# Patient Record
Sex: Male | Born: 2000 | Race: White | Hispanic: No | Marital: Single | Smoking: Never smoker
Health system: Southern US, Community
[De-identification: ages and names within clinical notes are randomized; demographics above are authoritative.]

---
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
## 2022-06-08 ENCOUNTER — Emergency Department: Payer: BC Managed Care – PPO

## 2022-06-08 ENCOUNTER — Other Ambulatory Visit: Payer: Self-pay

## 2022-06-08 ENCOUNTER — Emergency Department
Admission: EM | Admit: 2022-06-08 | Discharge: 2022-06-09 | Disposition: A | Discharge status: Home / Self Care | Payer: BC Managed Care – PPO | Attending: Emergency Medicine | Admitting: Emergency Medicine

## 2022-06-08 DIAGNOSIS — R112 Nausea with vomiting, unspecified: Secondary | ICD-10-CM | POA: Diagnosis not present

## 2022-06-08 DIAGNOSIS — R197 Diarrhea, unspecified: Secondary | ICD-10-CM | POA: Insufficient documentation

## 2022-06-08 DIAGNOSIS — R1031 Right lower quadrant pain: Secondary | ICD-10-CM | POA: Insufficient documentation

## 2022-06-08 DIAGNOSIS — E860 Dehydration: Secondary | ICD-10-CM

## 2022-06-08 LAB — CBC
HCT: 48.7 % (ref 39.0–52.0)
Hemoglobin: 16.5 g/dL (ref 13.0–17.0)
MCH: 30.6 pg (ref 26.0–34.0)
MCHC: 33.9 g/dL (ref 30.0–36.0)
MCV: 90.2 fL (ref 80.0–100.0)
Platelets: 218 10*3/uL (ref 150–400)
RBC: 5.4 MIL/uL (ref 4.22–5.81)
RDW: 11.5 % (ref 11.5–15.5)
WBC: 9.7 10*3/uL (ref 4.0–10.5)
nRBC: 0 % (ref 0.0–0.2)

## 2022-06-08 LAB — LIPASE, BLOOD: Lipase: 34 U/L (ref 11–51)

## 2022-06-08 LAB — URINALYSIS, ROUTINE W REFLEX MICROSCOPIC
Bacteria, UA: NONE SEEN
Bilirubin Urine: NEGATIVE
Glucose, UA: NEGATIVE mg/dL
Hgb urine dipstick: NEGATIVE
Ketones, ur: 80 mg/dL — AB
Leukocytes,Ua: NEGATIVE
Nitrite: NEGATIVE
Protein, ur: 30 mg/dL — AB
Specific Gravity, Urine: 1.027 (ref 1.005–1.030)
pH: 7 (ref 5.0–8.0)

## 2022-06-08 LAB — COMPREHENSIVE METABOLIC PANEL
ALT: 23 U/L (ref 0–44)
AST: 29 U/L (ref 15–41)
Albumin: 4.8 g/dL (ref 3.5–5.0)
Alkaline Phosphatase: 67 U/L (ref 38–126)
Anion gap: 12 (ref 5–15)
BUN: 22 mg/dL — ABNORMAL HIGH (ref 6–20)
CO2: 23 mmol/L (ref 22–32)
Calcium: 10 mg/dL (ref 8.9–10.3)
Chloride: 102 mmol/L (ref 98–111)
Creatinine, Ser: 1.09 mg/dL (ref 0.61–1.24)
GFR, Estimated: >60 mL/min (ref >60)
Glucose, Bld: 151 mg/dL — ABNORMAL HIGH (ref 70–99)
Potassium: 4 mmol/L (ref 3.5–5.1)
Sodium: 137 mmol/L (ref 135–145)
Total Bilirubin: 1.7 mg/dL — ABNORMAL HIGH (ref 0.3–1.2)
Total Protein: 8.1 g/dL (ref 6.5–8.1)

## 2022-06-08 MED ORDER — FENTANYL CITRATE PF 50 MCG/ML IJ SOSY
50.0000 ug | PREFILLED_SYRINGE | Freq: Once | INTRAMUSCULAR | Status: AC
  Administered 2022-06-08: 50 ug via INTRAVENOUS
  Filled 2022-06-08: qty 1

## 2022-06-08 MED ORDER — LACTATED RINGERS IV BOLUS
1000.0000 mL | Freq: Once | INTRAVENOUS | Status: AC
  Administered 2022-06-08: 1000 mL via INTRAVENOUS

## 2022-06-08 MED ORDER — HYDROMORPHONE HCL 1 MG/ML IJ SOLN
1.0000 mg | Freq: Once | INTRAMUSCULAR | Status: AC
  Administered 2022-06-08: 1 mg via INTRAVENOUS
  Filled 2022-06-08: qty 1

## 2022-06-08 MED ORDER — IOHEXOL 300 MG/ML  SOLN
100.0000 mL | Freq: Once | INTRAMUSCULAR | Status: AC | PRN
  Administered 2022-06-08: 100 mL via INTRAVENOUS

## 2022-06-08 MED ORDER — ONDANSETRON HCL 4 MG/2ML IJ SOLN
4.0000 mg | Freq: Once | INTRAMUSCULAR | Status: AC
  Administered 2022-06-08: 4 mg via INTRAVENOUS
  Filled 2022-06-08: qty 2

## 2022-06-08 NOTE — ED Provider Notes (Signed)
[facility] Provider Note    Event Date/Time   First MD Initiated Contact with Patient 06/08/22 2308     (approximate)   History   Abdominal Pain (x3pm)   HPI  Carlos Lloyd is a 22 y.o. male who presents to the ED for evaluation of Abdominal Pain (x3pm)   Otherwise healthy local college student presents to the ED with his mother for evaluation of about 12 hours of nausea, vomiting and diarrhea and abdominal pain.  Pain is initially generalized with emesis and diarrhea and he thought it was "just a stomach bug."  Pain then radiated towards the right side and patient reports feeling worse and poor appetite and unable to keep anything down so he presents to the ED.  No known fevers.  No significant medical history or surgical history.   Physical Exam   Triage Vital Signs: ED Triage Vitals  Enc Vitals Group     BP 06/08/22 2246 134/78     Pulse Rate 06/08/22 2246 (!) 108     Resp 06/08/22 2246 (!) 26     Temp 06/08/22 2246 (!) 97.4 F (36.3 C)     Temp Source 06/08/22 2246 Oral     SpO2 06/08/22 2246 100 %     Weight 06/08/22 2248 155 lb (70.3 kg)     Height 06/08/22 2248 5' 11"$  (1.803 m)     Head Circumference --      Peak Flow --      Pain Score 06/08/22 2248 5     Pain Loc --      Pain Edu? --      Excl. in [place]? --     Most recent vital signs: Vitals:   06/09/22 0100 06/09/22 0135  BP: 118/67 122/70  Pulse: 98 80  Resp: 16 16  Temp:    SpO2: 98% 99%    General: Awake, no distress.  Seems dry.  Requesting water. CV:  Good peripheral perfusion.  Resp:  Normal effort.  Abd:  No distention.  Poorly localizing tenderness throughout the lower abdomen, including the RLQ.  No peritoneal features. MSK:  No deformity noted.  Neuro:  No focal deficits appreciated. Other:     ED Results / Procedures / Treatments   Labs (all labs ordered are listed, but only abnormal results are displayed) Labs Reviewed  COMPREHENSIVE METABOLIC PANEL -  Abnormal; Notable for the following components:      Result Value   Glucose, Bld 151 (*)    BUN 22 (*)    Total Bilirubin 1.7 (*)    All other components within normal limits  URINALYSIS, ROUTINE W REFLEX MICROSCOPIC - Abnormal; Notable for the following components:   Color, Urine YELLOW (*)    APPearance CLOUDY (*)    Ketones, ur 80 (*)    Protein, ur 30 (*)    All other components within normal limits  LIPASE, BLOOD  CBC    EKG   RADIOLOGY CT abdomen/pelvis interpreted by me without evidence of appendicitis or SBO.  Official radiology report(s): CT ABDOMEN PELVIS W CONTRAST  Result Date: 06/09/2022 CLINICAL DATA:  Right lower quadrant abdominal pain. EXAM: CT ABDOMEN AND PELVIS WITH CONTRAST TECHNIQUE: Multidetector CT imaging of the abdomen and pelvis was performed using the standard protocol following bolus administration of intravenous contrast. RADIATION DOSE REDUCTION: This exam was performed according to the departmental dose-optimization program which includes automated exposure control, adjustment of the mA and/or kV according to patient size and/or  use of iterative reconstruction technique. CONTRAST:  123m OMNIPAQUE IOHEXOL 300 MG/ML  SOLN COMPARISON:  None Available. FINDINGS: Lower chest: The visualized lung bases are clear. No intra-abdominal free air or free fluid. Hepatobiliary: No focal liver abnormality is seen. No gallstones, gallbladder wall thickening, or biliary dilatation. Pancreas: Unremarkable. No pancreatic ductal dilatation or surrounding inflammatory changes. Spleen: Normal in size without focal abnormality. Adrenals/Urinary Tract: The adrenal glands unremarkable. The kidneys and urinary bladder appear unremarkable. Stomach/Bowel: There is loose stool throughout the colon consistent with diarrheal state. Correlation with clinical exam and stool cultures recommended. There is no bowel obstruction or active inflammation. The appendix is not identified with  certainty. A somewhat linear or tubular structure in the right lower quadrant adjacent to the cecum (coronal 32/5) may represent a vessel or less likely a normal appendix. However, no inflammatory changes noted in the right lower quadrant or pericecal lesion. Vascular/Lymphatic: The abdominal aorta and IVC are unremarkable. No portal venous gas. There is no adenopathy. Reproductive: The prostate and seminal vesicles are grossly unremarkable. No pelvic mass. Other: None Musculoskeletal: No acute or significant osseous findings. IMPRESSION: 1. Diarrheal state. Correlation with clinical exam and stool cultures recommended. No bowel obstruction. 2. Nonvisualization of the appendix. No findings to suggest acute appendicitis. Electronically Signed   By: AAnner CreteM.D.   On: 06/09/2022 00:12    PROCEDURES and INTERVENTIONS:  Procedures  Medications  fentaNYL (SUBLIMAZE) injection 50 mcg (50 mcg Intravenous Given 06/08/22 2254)  ondansetron (ZOFRAN) injection 4 mg (4 mg Intravenous Given 06/08/22 2339)  lactated ringers bolus 1,000 mL (0 mLs Intravenous Stopped 06/09/22 0109)  HYDROmorphone (DILAUDID) injection 1 mg (1 mg Intravenous Given 06/08/22 2340)  iohexol (OMNIPAQUE) 300 MG/ML solution 100 mL (100 mLs Intravenous Contrast Given 06/08/22 2347)  lactated ringers bolus 1,000 mL (0 mLs Intravenous Stopped 06/09/22 0135)     IMPRESSION / MDM / ASSESSMENT AND PLAN / ED COURSE  I reviewed the triage vital signs and the nursing notes.  Differential diagnosis includes, but is not limited to, gastroenteritis, dehydration, acute cystitis, diverticulitis, appendicitis or cholecystitis.  {Patient presents with symptoms of an acute illness or injury that is potentially life-threatening.  Healthy 22year old presents with GI symptoms, possibly a gastroenteritis and suitable for outpatient management.  He is tachycardic and has RLQ tenderness on presentation concerning for the possibility of appendicitis.   Blood work without leukocytosis or significant metabolic derangements.  Normal lipase.  Urine without infectious features, but significant Mehta ketones suggestive of dehydration.  CT without evidence of appendicitis or more severe derangements beyond a diarrheal state.  Resolution of symptoms after fluid resuscitation.  Possibly viral etiology.  Suitable for outpatient management.  We discussed return precautions.  Clinical Course as of 06/09/22 0551  Mon Jun 09, 2022  0021 Reassessed.  Patient reports feeling better.  We discussed reassuring CT scan and possible etiologies of his symptoms.  Discussed ketonuria and dehydration [DS]  0116 Reassessed.  Feeling well.  Tolerating p.o.  We discussed Zofran, expectant management and return precautions.  Answered questions. [DS]    Clinical Course User Index [DS] SVladimir Crofts MD     FINAL CLINICAL IMPRESSION(S) / ED DIAGNOSES   Final diagnoses:  Right lower quadrant abdominal pain  Nausea vomiting and diarrhea  Dehydration     Rx / DC Orders   ED Discharge Orders          Ordered    ondansetron (ZOFRAN-ODT) 4 MG disintegrating tablet  Every 8 hours PRN,  Status:  Discontinued        06/09/22 0113    ondansetron (ZOFRAN-ODT) 4 MG disintegrating tablet  Every 8 hours PRN        06/09/22 0129             Note:  This document was prepared using Dragon voice recognition software and may include unintentional dictation errors.   Vladimir Crofts, MD 06/09/22 [id number]

## 2022-06-08 NOTE — ED Triage Notes (Signed)
Pt to ED from home for abdominal pain. Pt states "I think I have an appendicitis". Pt is cOX4. Pt is pale but not clammy. Pt cool to the touch. Pt placed in wheelchair. Pt has N/V/D and RLQ pain.

## 2022-06-08 NOTE — ED Notes (Signed)
Pt is unable to sit still in the wheelchair at this time. He is very restless and appears to be in pain.

## 2022-06-09 MED ORDER — LACTATED RINGERS IV BOLUS
1000.0000 mL | Freq: Once | INTRAVENOUS | Status: AC
  Administered 2022-06-09: 1000 mL via INTRAVENOUS

## 2022-06-09 MED ORDER — ONDANSETRON 4 MG PO TBDP: 4.0000 mg | ORAL_TABLET | Freq: Three times a day (TID) | ORAL | 0 refills | Status: DC | PRN

## 2022-06-09 NOTE — Discharge Instructions (Signed)
Please take Tylenol and ibuprofen/Advil for your pain.  It is safe to take them together, or to alternate them every few hours.  Take up to 1094m of Tylenol at a time, up to 4 times per day.  Do not take more than 4000 mg of Tylenol in 24 hours.  For ibuprofen, take 400-600 mg, 3 - 4 times per day.  Use the Zofran as needed for nausea and vomiting.  Return to the ED with any worsening symptoms or fevers despite these medications

## 2022-12-22 ENCOUNTER — Ambulatory Visit (INDEPENDENT_AMBULATORY_CARE_PROVIDER_SITE_OTHER): Payer: BC Managed Care – PPO | Admitting: Family Medicine

## 2022-12-22 ENCOUNTER — Encounter: Payer: Self-pay | Admitting: Family Medicine

## 2022-12-22 ENCOUNTER — Other Ambulatory Visit: Payer: Self-pay

## 2022-12-22 VITALS — BP 122/81 | HR 70 | Temp 96.7°F | Ht 71.65 in | Wt 152.0 lb

## 2022-12-22 DIAGNOSIS — R0981 Nasal congestion: Secondary | ICD-10-CM | POA: Diagnosis not present

## 2022-12-22 DIAGNOSIS — M75101 Unspecified rotator cuff tear or rupture of right shoulder, not specified as traumatic: Secondary | ICD-10-CM | POA: Diagnosis not present

## 2022-12-22 NOTE — Progress Notes (Signed)
[facility] [address] [place], [place] [address] Phone: [phone] Fax: [phone]   Office Visit Note  Patient Name: Carlos Lloyd  Date of [id number]  Med Rec number [id number]  Date of Service: 12/22/2022  Patient has no known allergies.  Chief Complaint  Patient presents with   Sinus Problem     7 to 10 day history of sinus pain with some nasal congestion No fever Taking ibuprofen and phenylephrine No history of fall allergies, no history of sinus infection  Also has had right shoulder pain for over 12 months - no specific injury, still works out - pain if lays on the side and it will wake him at night if he rolls onto that side  Sinus Problem Associated symptoms include congestion. Pertinent negatives include no coughing.      Current Medication:  Outpatient Encounter Medications as of 12/22/2022  Medication Sig   [DISCONTINUED] ondansetron (ZOFRAN-ODT) 4 MG disintegrating tablet Take 1 tablet (4 mg total) by mouth every 8 (eight) hours as needed. (Patient not taking: Reported on 12/22/2022)   No facility-administered encounter medications on file as of 12/22/2022.      Medical History: History reviewed. No pertinent past medical history.   Vital Signs: BP 122/81   Pulse 70   Temp (!) 96.7 F (35.9 C) (Tympanic)   Ht 5' 11.65" (1.82 m)   Wt 152 lb (68.9 kg)   SpO2 98%   BMI 20.81 kg/m    Review of Systems  Constitutional: Negative.   HENT:  Positive for congestion and sinus pain.   Respiratory:  Negative for cough.   Musculoskeletal:        See HPI    Physical Exam Vitals reviewed.  Constitutional:      Appearance: Normal appearance.  HENT:     Right Ear: Tympanic membrane normal.     Left Ear: Tympanic membrane normal.     Nose:     Right Turbinates: Swollen. Not pale.     Left Turbinates: Swollen. Not pale.     Right Sinus: Maxillary sinus tenderness and frontal sinus tenderness present.     Left Sinus: Maxillary  sinus tenderness and frontal sinus tenderness present.     Mouth/Throat:     Pharynx: Oropharynx is clear. Uvula midline.  Eyes:     Conjunctiva/sclera: Conjunctivae normal.  Pulmonary:     Effort: Pulmonary effort is normal.     Breath sounds: Normal breath sounds and air entry.  Musculoskeletal:     Right shoulder: Tenderness present. No swelling. Decreased range of motion. Normal strength.  Lymphadenopathy:     Cervical: No cervical adenopathy.  Neurological:     Mental Status: He is alert.     Assessment/Plan:  1. Sinus congestion Use flonase, 2 sprays to each nostril 2 times daily If no better in 48 hrs he will let me know and I will send antibiotics to his pharmacy in [place]  2. Rotator cuff syndrome of right shoulder  - Ambulatory referral to Physical Therapy      General Counseling: wilkes tagert understanding of the findings of todays visit and agrees with plan of treatment. I have discussed any further diagnostic evaluation that may be needed or ordered today. We also reviewed his medications today. he has been encouraged to call the office with any questions or concerns that should arise related to todays visit.   No orders of the defined types were placed in this encounter.   No orders of the  defined types were placed in this encounter.    Dr Durwin Reges Alyissa Whidbee ABFM University Physician

## 2022-12-24 ENCOUNTER — Other Ambulatory Visit: Payer: Self-pay | Admitting: Family Medicine

## 2022-12-24 ENCOUNTER — Encounter: Payer: Self-pay | Admitting: Family Medicine

## 2022-12-24 DIAGNOSIS — R0981 Nasal congestion: Secondary | ICD-10-CM

## 2022-12-24 MED ORDER — AMOXICILLIN-POT CLAVULANATE 875-125 MG PO TABS: 1.0000 | ORAL_TABLET | Freq: Two times a day (BID) | ORAL | 0 refills | Status: DC

## 2023-01-08 ENCOUNTER — Encounter: Payer: Self-pay | Admitting: Medical

## 2023-01-08 ENCOUNTER — Ambulatory Visit (INDEPENDENT_AMBULATORY_CARE_PROVIDER_SITE_OTHER): Payer: BC Managed Care – PPO | Admitting: Medical

## 2023-01-08 ENCOUNTER — Other Ambulatory Visit: Payer: Self-pay

## 2023-01-08 VITALS — HR 83 | Temp 97.4°F

## 2023-01-08 DIAGNOSIS — R519 Headache, unspecified: Secondary | ICD-10-CM | POA: Diagnosis not present

## 2023-01-08 MED ORDER — PREDNISONE 20 MG PO TABS: 40.0000 mg | ORAL_TABLET | Freq: Every day | ORAL | 0 refills | Status: AC

## 2023-01-08 NOTE — Progress Notes (Signed)
[facility] [address] [place], [place] [address] Phone: [phone] Fax: [phone]   Office Visit Note  Patient Name: Carlos Lloyd  Date of [id number]  Med Rec number [id number]  Date of Service: 01/08/2023  Allergies: Patient has no known allergies.  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  Seen 8/26 with 7-10 days of sinus pressure/congestion. Was started on Flonase.   Sx did not improve, so was given abx on 8/28, which he completed. Little improvement with abx. Continues to have frontal HA that comes and goes. Still using Flonase. No other meds currently. Denies nausea, vomiting or light sensitivity. No vision changes. No recent head injury. Not much nasal d/c currently.  Steroids were suggested as possible next step for treatment. Has not taken steroids before.   No hx of allergies.    Current Medication:  Outpatient Encounter Medications as of 01/08/2023  Medication Sig   [DISCONTINUED] amoxicillin-clavulanate (AUGMENTIN) 875-125 MG tablet Take 1 tablet by mouth 2 (two) times daily. (Patient not taking: Reported on 01/08/2023)   No facility-administered encounter medications on file as of 01/08/2023.      Medical History: History reviewed. No pertinent past medical history.   Vital Signs: Pulse 83   Temp (!) 97.4 F (36.3 C) (Tympanic)   SpO2 99%    Review of Systems See HPI  Physical Exam Vitals reviewed.  Constitutional:      General: He is not in acute distress.    Appearance: He is not ill-appearing.  HENT:     Head: Normocephalic.     Right Ear: Tympanic membrane, ear canal and external ear normal.     Left Ear: Tympanic membrane, ear canal and external ear normal.     Nose: No mucosal edema, congestion or rhinorrhea.     Right Turbinates: Swollen (mild).     Left Turbinates: Swollen (mild).     Right Sinus: No maxillary sinus tenderness or frontal sinus  tenderness.     Left Sinus: No maxillary sinus tenderness or frontal sinus tenderness.     Mouth/Throat:     Mouth: Mucous membranes are moist. No oral lesions.     Pharynx: No pharyngeal swelling or posterior oropharyngeal erythema.     Tonsils: No tonsillar exudate. 0 on the right. 0 on the left.  Cardiovascular:     Rate and Rhythm: Normal rate and regular rhythm.     Heart sounds: No murmur heard.    No friction rub. No gallop.  Pulmonary:     Effort: Pulmonary effort is normal.     Breath sounds: Normal breath sounds. No wheezing, rhonchi or rales.  Musculoskeletal:     Cervical back: Neck supple. No rigidity.  Lymphadenopathy:     Cervical: No cervical adenopathy.  Neurological:     Mental Status: He is alert.       Assessment/Plan: 1. Nonintractable headache, unspecified chronicity pattern, unspecified headache type Most likely sinus headache. Discussed possible adverse effects of steroid and intended benefits. Patient elected to proceed with steroid. Advised to avoid taking steroid close to bedtime and to take with food. Continue Flonase. He will contact provider or schedule follow up visit as needed.  - predniSONE (DELTASONE) 20 MG tablet; Take 2 tablets (40 mg total) by mouth daily with breakfast for 5 days.  Dispense: 10 tablet; Refill: 0  Patient Instructions  -Take Prednisone with food once daily for 5 days. Take  with breakfast ideally. -Continue Flonase/Fluticasone nasal spray, 2 sprays to each nostril once a day. -Send MyChart message to provider or schedule return visit as needed for new/worsening symptoms or if symptoms do not improve as discussed with prescribed treatment.       General Counseling: jhonathon rosenboom understanding of the findings of todays visit and agrees with plan of treatment. he has been encouraged to call the office with any questions or concerns that should arise related to todays visit.    Time spent:20 Minutes    Jonathon Resides  PA-C [facility] 01/08/2023 11:35 AM

## 2023-01-11 ENCOUNTER — Encounter: Payer: Self-pay | Admitting: Medical

## 2023-01-11 NOTE — Therapy (Deleted)
OUTPATIENT PHYSICAL THERAPY EVALUATION   Patient Name: Carlos Lloyd MRN: [id number] DOB:[birth year], 22 y.o., male Today's Date: 01/11/2023  END OF SESSION:   No past medical history on file. No past surgical history on file. There are no problems to display for this patient.   PCP: Eden Lathe, MD  REFERRING PROVIDER: Noralee Stain, MD   REFERRING DIAG: rotator cuff syndrome of right shoulder  THERAPY DIAG:  No diagnosis found.  Rationale for Evaluation and Treatment: Rehabilitation  ONSET DATE: ***  SUBJECTIVE:                                                                                                                                                                                      SUBJECTIVE STATEMENT: ***  Hand dominance: {MISC; OT HAND DOMINANCE:[phone]}  PERTINENT HISTORY: Patient is a 22 y.o. male who presents to outpatient physical therapy with a referral for medical diagnosis rotator cuff syndrome of right shoulder. This patient's chief complaints consist of ***, leading to the following functional deficits: ***. Relevant past medical history and comorbidities include ***.  Patient denies hx of {redflags:27294}  PAIN:  Are you having pain? Yes NPRS: Current: ***/10,  Best: ***/10, Worst: ***/10. Pain location: *** Pain description: *** Aggravating factors: *** Relieving factors: ***   FUNCTIONAL LIMITATIONS: ***  LEISURE: ***  PRECAUTIONS: {Therapy precautions:24002}  WEIGHT BEARING RESTRICTIONS: {Yes ***/No:24003}  FALLS:  Has patient fallen in last 6 months? {fallsyesno:27318}  LIVING ENVIRONMENT: Lives with: {OPRC lives with:25569::"lives with their family"} Lives in: {Lives in:25570} Stairs: {opstairs:27293} Has following equipment at home: {Assistive devices:23999}  OCCUPATION: ***  PLOF: {PLOF:24004}  PATIENT GOALS:***  NEXT MD VISIT:    OBJECTIVE  DIAGNOSTIC FINDINGS:  ***  SELF- REPORTED FUNCTION FOTO  score: ***/100 (shoulder questionnaire)  OBSERVATION/INSPECTION Posture Posture (seated): forward head, rounded shoulders, slumped in sitting.  Posture (standing): *** Posture correction: *** Anthropometrics Tremor: none Body composition: *** Muscle bulk: *** Skin: The incision sites appear to be healing well with no excessive redness, warmth, drainage or signs of infection present.  *** Edema: *** Functional Mobility Bed mobility: *** Transfers: *** Gait: grossly WFL for household and short community ambulation. More detailed gait analysis deferred to later date as needed. *** Stairs: ***  SPINE MOTION  LUMBAR SPINE AROM *Indicates pain Flexion: *** Extension: *** Side Flexion:   R ***  L *** Rotation:  R *** L *** Side glide:  R *** L *** Protraction: *** Retraction: ***   NEUROLOGICAL  Upper Motor Neuron Screen Babinski, Hoffman's and Clonus (ankle) negative bilaterally.  Dermatomes C2-T1 appears equal and intact to light touch except the following: *** L2-S2 appears equal  and intact to light touch except the following: *** Deep Tendon Reflexes R/L  ***+/***+ Biceps brachii reflex (C5, C6) ***+/***+ Brachioradialis reflex (C6) ***+/***+ Triceps brachii reflex (C7) ***+/***+ Quadriceps reflex (L4) ***+/***+ Achilles reflex (S1)  SPINE MOTION  CERVICAL SPINE AROM *Indicates pain Flexion: *** Extension: *** Side Flexion:   R ***  L *** Rotation:  R *** L ***   PERIPHERAL JOINT MOTION (in degrees)  ACTIVE RANGE OF MOTION (AROM) *Indicates pain Date Date Date  Joint/Motion R/L R/L R/L  Shoulder     Flexion / / /  Extension / / /  Abduction  / / /  External rotation / / /  Internal rotation / / /  Elbow     Flexion  / / /  Extension  / / /  Wrist     Flexion / / /  Extension  / / /  Radial deviation / / /  Ulnar deviation / / /  Pronation / / /  Supination / / /  Hip     Flexion / / /  Extension  / / /  Abduction / / /   Adduction / / /  External rotation / / /  Internal rotation  / / /  Knee     Extension / / /  Flexoin / / /  Ankle/Foot     Dorsiflexion (knee ext) / / /  Dorsiflexion (knee flex) / / /  Plantarflexion / / /  Everison / / /  Inversion / / /  Great toe extension / / /  Great toe flexion / / /  Comments:   PASSIVE RANGE OF MOTION (PROM) *Indicates pain Date Date Date  Joint/Motion R/L R/L R/L  Shoulder     Flexion / / /  Extension / / /  Abduction  / / /  External rotation / / /  Internal rotation / / /  Elbow     Flexion  / / /  Extension  / / /  Wrist     Flexion / / /  Extension  / / /  Radial deviation / / /  Ulnar deviation / / /  Pronation / / /  Supination / / /  Hip     Flexion  / / /  Extension  / / /  Abduction / / /  Adduction / / /  External rotation / / /  Internal rotation  / / /  Knee     Extension / / /  Flexion / / /  Ankle/Foot     Dorsiflexion (knee ext) / / /  Dorsiflexion (knee flex) / / /  Plantarflexion / / /  Everison / / /  Inversion / / /  Great toe extension / / /  Great toe flexion / / /  Comments:   MUSCLE PERFORMANCE (MMT):  *Indicates pain Date Date Date  Joint/Motion R/L R/L R/L  Shoulder     Flexion / / /  Abduction (C5) / / /  External rotation / / /  Internal rotation / / /  Extension / / /  Elbow     Flexion (C6) / / /  Extension (C7) / / /  Wrist     Flexion (C7) / / /  Extension (C6) / / /  Radial deviation / / /  Ulnar deviation (C8) / / /  Pronation / / /  Supination / / /  Hand  Thumb extension (C8) / / /  Finger abduction (T1) / / /  Grip (C8) / / /  Hip     Flexion (L1, L2) / / /  Extension (knee ext) / / /  Extension (knee flex) / / /  Abduction / / /  Adduction / / /  External rotation / / /  Internal rotation  / / /  Knee     Extension (L3) / / /  Flexion (S2) / / /  Ankle/Foot     Dorsiflexion (L4) / / /  Great toe extension (L5) / / /  Eversion (S1) / / /  Plantarflexion  (S1) / / /  Inversion / / /  Pronation / / /  Great toe flexion / / /  Comments:   SPECIAL TESTS:  .Neurodynamictests .NeurodynamicUE .NeurodynamicLE .CspineInstability .CSPINESPECIALTESTS .SHOULDERSPECIALTESTCLUSTERS .HIPSPECIALTESTS .SIJSPECIALTESTS   SHOULDER SPECIAL TESTS RTC, Impingement, Anterior Instability (macrotrauma), Labral Tear: Painful arc test: R = ***, L = ***. Drop arm test: R = ***, L = ***. Hawkins-Kennedy test: R = ***, L = ***. Infraspinatus test: R = ***, L = ***. Apprehension test: R = ***, L = ***. Relocation test: R = ***, L = ***. Active compression test: R = ***, L = ***.  ACCESSORY MOTION: ***  PALPATION: ***  SUSTAINED POSITIONS TESTING:  ***  REPEATED MOTIONS TESTING: ***  FUNCTIONAL/BALANCE TESTS: Five Time Sit to Stand (5TSTS): *** seconds Functional Gait Assessment (FGA): ***/30 (see details above) Ten meter walking trial ( ): *** m/s Six Minute Walk Test ( ): *** feet Timed Up and Go (TUG): *** seconds   Dynamic Gait Index: ***/24 BERG Balance Scale: ***/56 Tinetti/POMA: ***/28 Timed Up and GO: *** seconds (average of 3 trials) Trial 1: *** Trial 2: *** Trial 3: *** Romberg test: -Narrow stance, eyes open: *** seconds -Narrow stance, eyes closed: *** seconds Sharpened Romberg test: -Tandem stance, eyes open: *** seconds -Tandem stance, eyes closed: *** seconds  Narrow stance, firm surface, eyes open: *** seconds Narrow stance, firm surface, eyes closed: *** seconds Narrow stance, compliant surface, eyes open: *** seconds Narrow stance, compliant surface, eyes closed: *** seconds Single leg stance, firm surface, eyes open: R= *** seconds, L= *** seconds Single leg stance, compliant surface, eyes open: R= *** seconds, L= *** seconds Gait speed: *** m/s Functional reach test: *** inches       TODAY'S TREATMENT:     PATIENT EDUCATION: Education details: *** Person educated: {Person  educated:25204} Education method: {Education Method:25205} Education comprehension: {Education Comprehension:25206}  HOME EXERCISE PROGRAM: ***  ASSESSMENT:  CLINICAL IMPRESSION: Patient is a 22 y.o. male referred to outpatient physical therapy with a medical diagnosis of rotator cuff syndrome of right shoulder who presents with signs and symptoms consistent with ***. Patient presents with significant *** impairments that are limiting ability to complete *** without difficulty. Patient will benefit from skilled physical therapy intervention to address current body structure impairments and activity limitations to improve function and work towards goals set in current POC in order to return to prior level of function or maximal functional improvement.   OBJECTIVE IMPAIRMENTS: {opptimpairments:25111}.   ACTIVITY LIMITATIONS: {activitylimitations:27494}  PARTICIPATION LIMITATIONS: {participationrestrictions:25113}  PERSONAL FACTORS: {Personal factors:25162} are also affecting patient's functional outcome.   REHAB POTENTIAL: {rehabpotential:25112}  CLINICAL DECISION MAKING: {clinical decision making:25114}  EVALUATION COMPLEXITY: {Evaluation complexity:25115}   GOALS: Goals reviewed with patient? {yes/no:20286}  SHORT TERM GOALS: Target date: 01/25/2023  Patient will be independent with initial home exercise program  for self-management of symptoms. Baseline: {HEPbaseline4:27310} (01/11/23); Goal status: INITIAL   LONG TERM GOALS: Target date: 04/05/2023  Patient will be independent with a long-term home exercise program for self-management of symptoms.  Baseline: {HEPbaseline4:27310} (01/11/23); Goal status: INITIAL  2.  Patient will demonstrate improved FOTO to equal or greater than *** by visit #*** to demonstrate improvement in overall condition and self-reported functional ability.  Baseline: *** (01/11/23); Goal status: INITIAL  3.  *** Baseline: *** (01/11/23); Goal  status: INITIAL  4.  *** Baseline: *** (01/11/23); Goal status: INITIAL  5.  Patient will complete community, work and/or recreational activities without limitation due to current condition.  Baseline: *** (01/11/23); Goal status: INITIAL  6.  *** Baseline: *** Goal status: INITIAL   PLAN:  PT FREQUENCY: {rehab frequency:25116}  PT DURATION: {rehab duration:25117}  PLANNED INTERVENTIONS: {rehab planned interventions:25118::"Therapeutic exercises","Therapeutic activity","Neuromuscular re-education","Balance training","Gait training","Patient/Family education","Self Care","Joint mobilization"}  PLAN FOR NEXT SESSION: ***   Cira Rue, PT, DPT 01/11/2023, 9:10 PM   [facility] [address] [place], [place] [address] P: [phone] I F: [phone]

## 2023-01-11 NOTE — Patient Instructions (Signed)
-  Take Prednisone with food once daily for 5 days. Take with breakfast ideally. -Continue Flonase/Fluticasone nasal spray, 2 sprays to each nostril once a day. -Send MyChart message to provider or schedule return visit as needed for new/worsening symptoms or if symptoms do not improve as discussed with prescribed treatment.

## 2023-01-20 ENCOUNTER — Ambulatory Visit: Payer: BC Managed Care – PPO | Admitting: Physical Therapy

## 2023-01-20 ENCOUNTER — Ambulatory Visit: Payer: BC Managed Care – PPO | Attending: Family Medicine | Admitting: Physical Therapy

## 2023-01-20 ENCOUNTER — Encounter: Payer: Self-pay | Admitting: Physical Therapy

## 2023-01-20 DIAGNOSIS — M75101 Unspecified rotator cuff tear or rupture of right shoulder, not specified as traumatic: Secondary | ICD-10-CM | POA: Insufficient documentation

## 2023-01-20 DIAGNOSIS — G8929 Other chronic pain: Secondary | ICD-10-CM | POA: Diagnosis present

## 2023-01-20 DIAGNOSIS — M25511 Pain in right shoulder: Secondary | ICD-10-CM | POA: Insufficient documentation

## 2023-01-20 NOTE — Therapy (Signed)
OUTPATIENT PHYSICAL THERAPY EVALUATION   Patient Name: Carlos Lloyd MRN: [id number] DOB:03/03/2001, 22 y.o., male Today's Date: 01/20/2023  END OF SESSION:  PT End of Session - 01/20/23 1136     Visit Number 1    Number of Visits 13    Date for PT Re-Evaluation 04/14/23    Authorization Type BCBS COMM PPO reporting period from 01/20/2023    PT Start Time 1037    PT Stop Time 1115    PT Time Calculation (min) 38 min    Activity Tolerance Patient tolerated treatment well    Behavior During Therapy [facility] for tasks assessed/performed             History reviewed. No pertinent past medical history. History reviewed. No pertinent surgical history. There are no problems to display for this patient.   PCP: Eden Lathe, MD  REFERRING PROVIDER: Noralee Stain, MD   REFERRING DIAG: rotator cuff syndrome of right shoulder  THERAPY DIAG:  Chronic right shoulder pain  Rationale for Evaluation and Treatment: Rehabilitation  ONSET DATE: approx 1 year prior to PT evaluation  SUBJECTIVE:                                                                                                                                                                                      SUBJECTIVE STATEMENT: Patient  reports his right shoulder has been bothering him for about a year on the back side. He thinks he might have injured it from working out. He was doing a row and he felt it in the back of his shoulder. He states the pain has moved around a few times. The most annoying thing is sleeping. If he ever turns on his right side, it prevents him from sleeping. It also sometimes hurts when he is brushing his teeth. He thinks it is getting better overall but it will randomly flair up. He expects it would start hurting if he were to go out and throw a football. Denies neck pain or paresthesia anywhere. Working out is going pretty good after he found exercises that didn't hurt it. He does not take any  medications for it now or any other treatments. He states ibuprofen helped a little. He does get a little clicking and popping in his right shoulder but not left. Reports nothing in particular provokes it.   Hand dominance: Right  PERTINENT HISTORY: Patient is a 22 y.o. male who presents to outpatient physical therapy with a referral for medical diagnosis rotator cuff syndrome of right shoulder. This patient's chief complaints consist of chronic right shoulder pain leading to the following functional deficits: difficulty with sleeping, ADLS (brushing teeth,  shaving), lifting things like a couch, throwing a foot ball (overhand throwing), anything that requires using the right UE a lot, especially overhead. Relevant past medical history and comorbidities include none reported.  Patient denies hx of cancer, stroke, seizures, lung problems, heart problems, diabetes, unexplained weight loss, unexplained changes in bowel or bladder problems, unexplained stumbling or dropping things, osteoporosis, and spinal surgery  PAIN:  Are you having pain? Yes NPRS: Current: 3/10,  Best: 0/10, Worst: 7/10. Pain location: over posterior joint line of right shoulder, sometimes it moves around and he can feel it in the upper trap or over the triceps/lateral upper arm when it was worse.  Pain description: achy Aggravating factors: sleeping on the right side, throwing a football, bench press, pulling exercises, reaching overhead for a long time, brushing teeth,  Relieving factors: resting, ice.    FUNCTIONAL LIMITATIONS: difficulty with sleeping, ADLS (brushing teeth, shaving), lifting things like a couch, throwing a foot ball (overhand throwing), anything that requires using the right UE a lot, especially overhead.   LEISURE: Play sports like basketball, throwing around a football. He is on an intermural basketball team.   PRECAUTIONS: None  WEIGHT BEARING RESTRICTIONS: No  FALLS:  Has patient fallen in last 6  months? 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   PLOF: Independent  PATIENT GOALS: "to be able to sleep on that side again" "be able to do simple tasks without pain" " and "be able to reach up in the air with my arm for a while without pain"  NEXT MD VISIT:    OBJECTIVE  DIAGNOSTIC FINDINGS:  No recent imaging.   SELF- REPORTED FUNCTION FOTO score: 62/100 (shoulder questionnaire)  OBSERVATION/INSPECTION Posture Posture (seated): WFL Anthropometrics Tremor: none Body composition: BMI: 21.2 Muscle bulk: well muscled, WNL Skin: WFL where visualized Edema: none Functional Mobility Bed mobility: supine <> sit I Transfers: sit <> stand I Gait: grossly WFL for household and short community ambulation. More detailed gait analysis deferred to later date as needed.   PERIPHERAL JOINT MOTION (in degrees) ACTIVE RANGE OF MOTION (AROM) *Indicates pain 01/20/23 Date Date  Joint/Motion R/L R/L R/L  Shoulder     Flexion 175*/172 / /  Extension 60/55 / /  Abduction  180/180 / /  External rotation 111*/113 / /  Internal rotation T6*/T4 / /  01/20/2023: B elbows, wrists, hands grossly WFL.   MUSCLE PERFORMANCE (MMT):  *Indicates pain 01/20/23 Date Date  Joint/Motion R/L R/L R/L  Shoulder     Flexion 5/5 / /  Abduction (C5) 5/5 / /  External rotation 4+*/5 / /  Internal rotation 5/5 / /  Extension / / /  Elbow     Flexion (C6) 5/5 / /  Extension (C7) 5/5 / /  Hand     Thumb extension (C8) B WNL / /  Finger abduction (T1) B WNL / /  Comments:   SPECIAL TESTS:  SHOULDER SPECIAL TESTS Painful arc test: R = positive. Hawkins-Kennedy test: R = positive, L = positive. Infraspinatus test: R = positive (pain/weakness) Apprehension test: R = negative Relocation test: R = negative O'Brien's test: R = positive for pain Crank Test: R = negative Clunk Test: R = negative Anterior/posterior Slide test: R = negative   Anterior load and shift: R = slightly more mobile than left Sulcus sign and 0 and 90: R = slightly more mobile than left AC sheer test: R = negative.  ACCESSORY MOTION: R GHJ slightly more mobile than left in all directions.   PALPATION: TTP over R infraspinatus and supraspinatus regions.     TODAY'S TREATMENT:   Therapeutic exercise: to centralize symptoms and improve ROM, strength, muscular endurance, and activity tolerance required for successful completion of functional activities.  - seated R shoulder ER with elbow elevated on plinth at approx 80 degrees scaption, 1x10 with 5#DB (easy/moderate), 1x10 with 8# DB (appropriate fatigue) - standing R shoulder scaption to 90 degrees, 1x10 with 5#DB (appropriate fatigue).  - Education on diagnosis, prognosis, POC, anatomy and physiology of current condition.  - Education on HEP including handout   Pt required multimodal cuing for proper technique and to facilitate improved neuromuscular control, strength, range of motion, and functional ability resulting in improved performance and form.  PATIENT EDUCATION: Education details: Exercise purpose/form. Self management techniques. Education on diagnosis, prognosis, POC, anatomy and physiology of current condition. Education on HEP including handout. Person educated: Patient Education method: Explanation, Demonstration, and Handouts Education comprehension: verbalized understanding, returned demonstration, and needs further education  HOME EXERCISE PROGRAM: Access Code: [email or web address] URL: [email or web address] Date: 01/20/2023 Prepared by: Norton Blizzard  Exercises - Seated Shoulder External Rotation in Abduction Supported with Dumbbell  - 1 x daily - 3 sets - 15 reps - Single Arm Scaption with Dumbbell  - 1 x daily - 3 sets - 15 reps  ASSESSMENT:  CLINICAL IMPRESSION: Patient is a 22 y.o. male referred to outpatient physical therapy with a medical diagnosis of rotator cuff  syndrome of right shoulder who presents with signs and symptoms most consistent with chronic right shoulder pain, most likely rotator cuff related with less suspicion for labra tear. Patient demonstrates painful arc, pain/weakness with R shoulder resisted ER, positive hawkins-kennedy and popping/catching is not a chief compliant. He does appear to have slightly more laxity in the right GHJ compared to the left and pain in the posterior shoulder with anterior glide of GHJ while in maximum ER may suggest internal impingement. Positive O'brien's test is not corroborated well by other tests or symptoms. Patient would most likely benefit from stabilization and strengthening program for the right shoulder girdle with interventions for pain control and improved muscle function a needed. Patient presents with significant pain, ROM, joint mobility, muscle performance (strength/power/endurance), motor control, and activity tolerance impairments that are limiting ability to complete usual activities such a sleeping, ADLS (brushing teeth, shaving), lifting things like a couch, throwing a foot ball (overhand throwing), anything that requires using the right UE a lot, especially overhead without difficulty. Patient will benefit from skilled physical therapy intervention to address current body structure impairments and activity limitations to improve function and work towards goals set in current POC in order to return to prior level of function or maximal functional improvement.   OBJECTIVE IMPAIRMENTS: decreased activity tolerance, decreased endurance, decreased knowledge of condition, decreased ROM, decreased strength, impaired perceived functional ability, increased muscle spasms, impaired UE functional use, and pain.   ACTIVITY LIMITATIONS: carrying, lifting, sleeping, reach over head, and hygiene/grooming  PARTICIPATION LIMITATIONS: community activity, occupation, school, and   difficulty with sleeping, ADLS (brushing  teeth, shaving), lifting things like a couch, throwing a foot ball (overhand throwing), anything that requires using the right UE a lot, especially overhead  PERSONAL FACTORS: Past/current experiences and Time since onset of injury/illness/exacerbation are also affecting patient's functional outcome.   REHAB POTENTIAL: Good  CLINICAL DECISION MAKING: Stable/uncomplicated  EVALUATION COMPLEXITY: Low   GOALS: Goals reviewed with patient? No  SHORT TERM GOALS: Target date: 02/03/2023  Patient will be independent with initial home exercise program for self-management of symptoms. Baseline: Initial HEP provided at IE (01/20/23); Goal status: INITIAL   LONG TERM GOALS: Target date: 04/14/2023  Patient will be independent with a long-term home exercise program for self-management of symptoms.  Baseline: Initial HEP provided at IE (01/20/23); Goal status: INITIAL  2.  Patient will demonstrate improved FOTO to equal or greater than 80 by visit #10 to demonstrate improvement in overall condition and self-reported functional ability.  Baseline: 62 (01/20/23); Goal status: INITIAL  3.  Patient will demonstrate R shoulder strength equal or greater to L shoulder strength with no increase in pain to improve his ability to use R UE for daily tasks such as overhead work, throwing a football, and grooming tasks with less difficulty.  Baseline: R ER 4+/5 and painful (01/20/23); Goal status: INITIAL  4.  Patient will demonstrate ability to perform 20 consecutive overhead throws at the rebounder with 1kg med ball using R UE without increased pain to improve his ability to play sports, perform repetative overhead activities, and throw a football.  Baseline: reports throwing a football overhand is painful (01/20/23); Goal status: INITIAL  5.  Patient will report being able to sleep on his right side without being awoken by right shoulder pain to improve his ability to sleep comfortably.  Baseline:  awoken by pain whenever he rolls on the right side (01/20/23); Goal status: INITIAL   PLAN:  PT FREQUENCY: 1-2x/week  PT DURATION: 12 weeks  PLANNED INTERVENTIONS: Therapeutic exercises, Therapeutic activity, Neuromuscular re-education, Patient/Family education, Self Care, Joint mobilization, Dry Needling, Electrical stimulation, Spinal mobilization, Cryotherapy, Moist heat, Manual therapy, and Re-evaluation  PLAN FOR NEXT SESSION: update HEP as appropriate, progressive loading as tolerated to the shoulder girdle focusing on rotator cuff musculature, functional/UE strengthening/motor control exercises, manual therapy/dry needling as needed, education.    Cira Rue, PT, DPT 01/20/2023, 6:53 PM   [facility] [address] [place], [place] [address] P: [phone] I F: [phone]

## 2023-01-22 ENCOUNTER — Encounter: Payer: Self-pay | Admitting: Physical Therapy

## 2023-01-22 ENCOUNTER — Ambulatory Visit: Payer: BC Managed Care – PPO | Admitting: Physical Therapy

## 2023-01-22 VITALS — BP 119/73 | HR 65

## 2023-01-22 DIAGNOSIS — G8929 Other chronic pain: Secondary | ICD-10-CM

## 2023-01-22 DIAGNOSIS — M25511 Pain in right shoulder: Secondary | ICD-10-CM | POA: Diagnosis not present

## 2023-01-22 NOTE — Therapy (Signed)
OUTPATIENT PHYSICAL THERAPY TREATMENT   Patient Name: Carlos Lloyd MRN: [id number] DOB:Oct 31, 2000, 22 y.o., male Today's Date: 01/22/2023  END OF SESSION:  PT End of Session - 01/22/23 1043     Visit Number 2    Number of Visits 13    Date for PT Re-Evaluation 04/14/23    Authorization Type BCBS COMM PPO reporting period from 01/20/2023    PT Start Time 1034    PT Stop Time 1113    PT Time Calculation (min) 39 min    Activity Tolerance Patient tolerated treatment well    Behavior During Therapy [facility] for tasks assessed/performed              History reviewed. No pertinent past medical history. History reviewed. No pertinent surgical history. There are no problems to display for this patient.   PCP: Eden Lathe, MD  REFERRING PROVIDER: Noralee Stain, MD   REFERRING DIAG: rotator cuff syndrome of right shoulder  THERAPY DIAG:  Chronic right shoulder pain  Rationale for Evaluation and Treatment: Rehabilitation  ONSET DATE: approx 1 year prior to PT evaluation  PERTINENT HISTORY: Patient is a 22 y.o. male who presents to outpatient physical therapy with a referral for medical diagnosis rotator cuff syndrome of right shoulder. This patient's chief complaints consist of chronic right shoulder pain leading to the following functional deficits: difficulty with sleeping, ADLS (brushing teeth, shaving), lifting things like a couch, throwing a foot ball (overhand throwing), anything that requires using the right UE a lot, especially overhead. Relevant past medical history and comorbidities include none reported.  Patient denies hx of cancer, stroke, seizures, lung problems, heart problems, diabetes, unexplained weight loss, unexplained changes in bowel or bladder problems, unexplained stumbling or dropping things, osteoporosis, and spinal surgery  SUBJECTIVE:                                                                                                                                                                                       SUBJECTIVE STATEMENT: Patient reports he is feeling well with no pain upon arrival. He did his HEP with 2x10 reps using 10lbs and stopped there because he was feeling pretty tired and he wanted to see how his shoulder did afterwards.   PAIN:  NPRS: 0/10  PATIENT GOALS: "to be able to sleep on that side again" "be able to do simple tasks without pain" " and "be able to reach up in the air with my arm for a while without pain"  NEXT MD VISIT:    OBJECTIVE  Vitals:   01/22/23 1038  BP: 119/73  Pulse: 65  SpO2: 100%     TODAY'S  TREATMENT:   Therapeutic exercise: to centralize symptoms and improve ROM, strength, muscular endurance, and activity tolerance required for successful completion of functional activities.  - Upper body ergometer level 7 to encourage joint nutrition, warm tissue, induce analgesic effect of aerobic exercise, improve muscular strength and endurance,  and prepare for remainder of session. 5  min total, changing directions ever 1 min.   Superset: - seated R shoulder ER with elbow elevated on plinth at approx 80 degrees scaption, 2x10 with 10#DB. - standing R shoulder scaption to 90 degrees, 2x10 with 10#DB.   (Manual therapy / dry needling - see below).   - seated R shoulder ER with elbow elevated on plinth at approx 80 degrees scaption, 2x10 with 10#DB. - standing R shoulder scaption to 90 degrees, 2x10 with 10#DB.  - standing reverse overhead throw with BlackTB anchored at floor, 1x15 - gyroball: education/demonstration on how to use, plus several attempts resulting in < 1 min success.  - standing reverse overhead throw with BlackTB anchored at floor, 1x15 - Education on HEP including handout   Manual therapy: to reduce pain and tissue tension, improve range of motion, neuromodulation, in order to promote improved ability to complete functional activities. PRONE - STM to right infraspinatus and  supraspinatus  Modality: Dry needling performed to right posterior shoulder to decrease pain and spasms along patient's right shoulder region with patient in prone utilizing 2 dry needle(s) .25mm x 40mm with 2 sticks at right infraspinatus and one stick at right supraspinatus. Patient educated about the risks and benefits from therapy and verbally consents to treatment.  Dry needling performed by Luretha Murphy. Ilsa Iha PT, DPT who is certified in this technique.   Pt required multimodal cuing for proper technique and to facilitate improved neuromuscular control, strength, range of motion, and functional ability resulting in improved performance and form.  PATIENT EDUCATION: Education details: Exercise purpose/form. Self management techniques. Education on diagnosis, prognosis, POC, anatomy and physiology of current condition. Education on HEP including handout. Person educated: Patient Education method: Explanation, Demonstration, and Handouts Education comprehension: verbalized understanding, returned demonstration, and needs further education  HOME EXERCISE PROGRAM: Access Code: [email or web address] URL: [email or web address] Date: 01/20/2023 Prepared by: Norton Blizzard  Exercises - Seated Shoulder External Rotation in Abduction Supported with Dumbbell  - 1 x daily - 3 sets - 15 reps - Single Arm Scaption with Dumbbell  - 1 x daily - 3 sets - 15 reps  ASSESSMENT:  CLINICAL IMPRESSION: Patient arrives reporting successful participation in HEP including increasing load to 10# for both movements. Continued with loading to infraspinatus and supraspinatus at today's session and added gyroball exercises and reverse throw exercise for improved joint stability and posterior cuff strengthening. Patient received dry needling to the infraspinatus and supraspinatus with no immediate effect noted except some aching in the infraspinatus region. Plan to continue with progressive R shoulder girdle strengthening  an stability exercises as tolerated. Patient would benefit from continued management of limiting condition by skilled physical therapist to address remaining impairments and functional limitations to work towards stated goals and return to PLOF or maximal functional independence.    From Initial PT evaluation 01/20/2023: Patient is a 22 y.o. male referred to outpatient physical therapy with a medical diagnosis of rotator cuff syndrome of right shoulder who presents with signs and symptoms most consistent with chronic right shoulder pain, most likely rotator cuff related with less suspicion for labra tear. Patient demonstrates painful arc, pain/weakness with R shoulder resisted ER, positive hawkins-kennedy and popping/catching  is not a chief compliant. He does appear to have slightly more laxity in the right GHJ compared to the left and pain in the posterior shoulder with anterior glide of GHJ while in maximum ER may suggest internal impingement. Positive O'brien's test is not corroborated well by other tests or symptoms. Patient would most likely benefit from stabilization and strengthening program for the right shoulder girdle with interventions for pain control and improved muscle function a needed. Patient presents with significant pain, ROM, joint mobility, muscle performance (strength/power/endurance), motor control, and activity tolerance impairments that are limiting ability to complete usual activities such a sleeping, ADLS (brushing teeth, shaving), lifting things like a couch, throwing a foot ball (overhand throwing), anything that requires using the right UE a lot, especially overhead without difficulty. Patient will benefit from skilled physical therapy intervention to address current body structure impairments and activity limitations to improve function and work towards goals set in current POC in order to return to prior level of function or maximal functional improvement.   OBJECTIVE  IMPAIRMENTS: decreased activity tolerance, decreased endurance, decreased knowledge of condition, decreased ROM, decreased strength, impaired perceived functional ability, increased muscle spasms, impaired UE functional use, and pain.   ACTIVITY LIMITATIONS: carrying, lifting, sleeping, reach over head, and hygiene/grooming  PARTICIPATION LIMITATIONS: community activity, occupation, school, and   difficulty with sleeping, ADLS (brushing teeth, shaving), lifting things like a couch, throwing a foot ball (overhand throwing), anything that requires using the right UE a lot, especially overhead  PERSONAL FACTORS: Past/current experiences and Time since onset of injury/illness/exacerbation are also affecting patient's functional outcome.   REHAB POTENTIAL: Good  CLINICAL DECISION MAKING: Stable/uncomplicated  EVALUATION COMPLEXITY: Low   GOALS: Goals reviewed with patient? No  SHORT TERM GOALS: Target date: 02/03/2023  Patient will be independent with initial home exercise program for self-management of symptoms. Baseline: Initial HEP provided at IE (01/20/23); Goal status: In-progress   LONG TERM GOALS: Target date: 04/14/2023  Patient will be independent with a long-term home exercise program for self-management of symptoms.  Baseline: Initial HEP provided at IE (01/20/23); Goal status: In-progress  2.  Patient will demonstrate improved FOTO to equal or greater than 80 by visit #10 to demonstrate improvement in overall condition and self-reported functional ability.  Baseline: 62 (01/20/23); Goal status: In-progress  3.  Patient will demonstrate R shoulder strength equal or greater to L shoulder strength with no increase in pain to improve his ability to use R UE for daily tasks such as overhead work, throwing a football, and grooming tasks with less difficulty.  Baseline: R ER 4+/5 and painful (01/20/23); Goal status: In-progress  4.  Patient will demonstrate ability to perform 20  consecutive overhead throws at the rebounder with 1kg med ball using R UE without increased pain to improve his ability to play sports, perform repetative overhead activities, and throw a football.  Baseline: reports throwing a football overhand is painful (01/20/23); Goal status: In-progress  5.  Patient will report being able to sleep on his right side without being awoken by right shoulder pain to improve his ability to sleep comfortably.  Baseline: awoken by pain whenever he rolls on the right side (01/20/23); Goal status: In-progress   PLAN:  PT FREQUENCY: 1-2x/week  PT DURATION: 12 weeks  PLANNED INTERVENTIONS: Therapeutic exercises, Therapeutic activity, Neuromuscular re-education, Patient/Family education, Self Care, Joint mobilization, Dry Needling, Electrical stimulation, Spinal mobilization, Cryotherapy, Moist heat, Manual therapy, and Re-evaluation  PLAN FOR NEXT SESSION: update HEP as appropriate, progressive  loading as tolerated to the shoulder girdle focusing on rotator cuff musculature, functional/UE strengthening/motor control exercises, manual therapy/dry needling as needed, education.    Cira Rue, PT, DPT 01/22/2023, 11:58 AM   [facility] [address] [place], [place] [address] P: [phone] I F: [phone]

## 2023-01-27 ENCOUNTER — Ambulatory Visit: Payer: BC Managed Care – PPO | Admitting: Physical Therapy

## 2023-01-27 NOTE — Therapy (Signed)
OUTPATIENT PHYSICAL THERAPY TREATMENT   Patient Name: Carlos Lloyd MRN: [id number] DOB:03-25-2001, 22 y.o., male Today's Date: 01/28/2023  END OF SESSION:  PT End of Session - 01/28/23 1512     Visit Number 3    Number of Visits 13    Date for PT Re-Evaluation 04/14/23    Authorization Type [employer] COMM PPO reporting period from 01/20/2023    PT Start Time 1514    PT Stop Time 1556    PT Time Calculation (min) 42 min    Activity Tolerance Patient tolerated treatment well    Behavior During Therapy [facility] for tasks assessed/performed               History reviewed. No pertinent past medical history. History reviewed. No pertinent surgical history. There are no problems to display for this patient.   PCP: Eden Lathe, MD  REFERRING PROVIDER: Noralee Stain, MD   REFERRING DIAG: rotator cuff syndrome of right shoulder  THERAPY DIAG:  Chronic right shoulder pain  Rationale for Evaluation and Treatment: Rehabilitation  ONSET DATE: approx 1 year prior to PT evaluation  PERTINENT HISTORY: Patient is a 22 y.o. male who presents to outpatient physical therapy with a referral for medical diagnosis rotator cuff syndrome of right shoulder. This patient's chief complaints consist of chronic right shoulder pain leading to the following functional deficits: difficulty with sleeping, ADLS (brushing teeth, shaving), lifting things like a couch, throwing a foot ball (overhand throwing), anything that requires using the right UE a lot, especially overhead. Relevant past medical history and comorbidities include none reported.  Patient denies hx of cancer, stroke, seizures, lung problems, heart problems, diabetes, unexplained weight loss, unexplained changes in bowel or bladder problems, unexplained stumbling or dropping things, osteoporosis, and spinal surgery  SUBJECTIVE:                                                                                                                                                                                       SUBJECTIVE STATEMENT:  Patient reports he is feeling well with no pain upon arrival. Tolerating HEP well with 10# but does report some soreness.   PAIN:  NPRS: 0/10  PATIENT GOALS: "to be able to sleep on that side again" "be able to do simple tasks without pain" " and "be able to reach up in the air with my arm for a while without pain"  NEXT MD VISIT:    OBJECTIVE  There were no vitals filed for this visit.    TODAY'S TREATMENT:   Therapeutic exercise: to centralize symptoms and improve ROM, strength, muscular endurance, and activity tolerance required for successful completion of functional  activities.   - Upper body ergometer level 9 to encourage joint nutrition, warm tissue, induce analgesic effect of aerobic exercise, improve muscular strength and endurance,  and prepare for remainder of session. 6 min total, changing directions ever 1 min.   - standing reverse overhead throw with BlackTB anchored at floor, 3x15, each UE  -Resisted ER walkout with black TB 1x15 (challenging - unable to get to 90/90), blue TB 2 x 15  -Resisted band walks on wall with black TB 2 x 10 -sidelying ER ball catch 3 x 1 minute - seated R shoulder ER with elbow elevated on plinth at approx 80 degrees scaption, 3x10 with 10#DB.   Pt required multimodal cuing for proper technique and to facilitate improved neuromuscular control, strength, range of motion, and functional ability resulting in improved performance and form.  PATIENT EDUCATION: Education details: Exercise purpose/form. Self management techniques. Education on diagnosis, prognosis, POC, anatomy and physiology of current condition. Education on HEP including handout. Person educated: Patient Education method: Explanation, Demonstration, and Handouts Education comprehension: verbalized understanding, returned demonstration, and needs further education  HOME EXERCISE  PROGRAM: Access Code: [email or web address] URL: [email or web address] Date: 01/20/2023 Prepared by: Norton Blizzard  Exercises - Seated Shoulder External Rotation in Abduction Supported with Dumbbell  - 1 x daily - 3 sets - 15 reps - Single Arm Scaption with Dumbbell  - 1 x daily - 3 sets - 15 reps  ASSESSMENT:  CLINICAL IMPRESSION:   Patient arrives to treatment session motivated to participate with no complaints of pain. Session focused on R shoulder strengthening and stabilization. Tolerated increase in intensity this session well. Patient would benefit from continued management of limiting condition by skilled physical therapist to address remaining impairments and functional limitations to work towards stated goals and return to PLOF or maximal functional independence.    From Initial PT evaluation 01/20/2023: Patient is a 22 y.o. male referred to outpatient physical therapy with a medical diagnosis of rotator cuff syndrome of right shoulder who presents with signs and symptoms most consistent with chronic right shoulder pain, most likely rotator cuff related with less suspicion for labra tear. Patient demonstrates painful arc, pain/weakness with R shoulder resisted ER, positive hawkins-kennedy and popping/catching is not a chief compliant. He does appear to have slightly more laxity in the right GHJ compared to the left and pain in the posterior shoulder with anterior glide of GHJ while in maximum ER may suggest internal impingement. Positive O'brien's test is not corroborated well by other tests or symptoms. Patient would most likely benefit from stabilization and strengthening program for the right shoulder girdle with interventions for pain control and improved muscle function a needed. Patient presents with significant pain, ROM, joint mobility, muscle performance (strength/power/endurance), motor control, and activity tolerance impairments that are limiting ability to complete usual  activities such a sleeping, ADLS (brushing teeth, shaving), lifting things like a couch, throwing a foot ball (overhand throwing), anything that requires using the right UE a lot, especially overhead without difficulty. Patient will benefit from skilled physical therapy intervention to address current body structure impairments and activity limitations to improve function and work towards goals set in current POC in order to return to prior level of function or maximal functional improvement.   OBJECTIVE IMPAIRMENTS: decreased activity tolerance, decreased endurance, decreased knowledge of condition, decreased ROM, decreased strength, impaired perceived functional ability, increased muscle spasms, impaired UE functional use, and pain.   ACTIVITY LIMITATIONS: carrying, lifting, sleeping, reach over head, and hygiene/grooming  PARTICIPATION LIMITATIONS:  community activity, occupation, school, and   difficulty with sleeping, ADLS (brushing teeth, shaving), lifting things like a couch, throwing a foot ball (overhand throwing), anything that requires using the right UE a lot, especially overhead  PERSONAL FACTORS: Past/current experiences and Time since onset of injury/illness/exacerbation are also affecting patient's functional outcome.   REHAB POTENTIAL: Good  CLINICAL DECISION MAKING: Stable/uncomplicated  EVALUATION COMPLEXITY: Low   GOALS: Goals reviewed with patient? No  SHORT TERM GOALS: Target date: 02/03/2023  Patient will be independent with initial home exercise program for self-management of symptoms. Baseline: Initial HEP provided at IE (01/20/23); Goal status: In-progress   LONG TERM GOALS: Target date: 04/14/2023  Patient will be independent with a long-term home exercise program for self-management of symptoms.  Baseline: Initial HEP provided at IE (01/20/23); Goal status: In-progress  2.  Patient will demonstrate improved FOTO to equal or greater than 80 by visit #10 to  demonstrate improvement in overall condition and self-reported functional ability.  Baseline: 62 (01/20/23); Goal status: In-progress  3.  Patient will demonstrate R shoulder strength equal or greater to L shoulder strength with no increase in pain to improve his ability to use R UE for daily tasks such as overhead work, throwing a football, and grooming tasks with less difficulty.  Baseline: R ER 4+/5 and painful (01/20/23); Goal status: In-progress  4.  Patient will demonstrate ability to perform 20 consecutive overhead throws at the rebounder with 1kg med ball using R UE without increased pain to improve his ability to play sports, perform repetative overhead activities, and throw a football.  Baseline: reports throwing a football overhand is painful (01/20/23); Goal status: In-progress  5.  Patient will report being able to sleep on his right side without being awoken by right shoulder pain to improve his ability to sleep comfortably.  Baseline: awoken by pain whenever he rolls on the right side (01/20/23); Goal status: In-progress   PLAN:  PT FREQUENCY: 1-2x/week  PT DURATION: 12 weeks  PLANNED INTERVENTIONS: Therapeutic exercises, Therapeutic activity, Neuromuscular re-education, Patient/Family education, Self Care, Joint mobilization, Dry Needling, Electrical stimulation, Spinal mobilization, Cryotherapy, Moist heat, Manual therapy, and Re-evaluation  PLAN FOR NEXT SESSION: update HEP as appropriate, progressive loading as tolerated to the shoulder girdle focusing on rotator cuff musculature, functional/UE strengthening/motor control exercises, manual therapy/dry needling as needed, education.    Viviann Spare, PT, DPT 01/28/2023, 3:13 PM   [facility] [address] [place], [place] [address] P: [phone] I F: [phone]

## 2023-01-28 ENCOUNTER — Ambulatory Visit: Payer: BC Managed Care – PPO | Attending: Family Medicine

## 2023-01-28 ENCOUNTER — Encounter: Payer: Self-pay | Admitting: Physical Therapy

## 2023-01-28 DIAGNOSIS — G8929 Other chronic pain: Secondary | ICD-10-CM | POA: Diagnosis present

## 2023-01-28 DIAGNOSIS — M25511 Pain in right shoulder: Secondary | ICD-10-CM | POA: Diagnosis present

## 2023-01-29 ENCOUNTER — Ambulatory Visit: Payer: BC Managed Care – PPO | Admitting: Physical Therapy

## 2023-01-29 ENCOUNTER — Encounter: Payer: Self-pay | Admitting: Physical Therapy

## 2023-01-29 DIAGNOSIS — M25511 Pain in right shoulder: Secondary | ICD-10-CM | POA: Diagnosis not present

## 2023-01-29 DIAGNOSIS — G8929 Other chronic pain: Secondary | ICD-10-CM

## 2023-01-29 NOTE — Therapy (Signed)
OUTPATIENT PHYSICAL THERAPY TREATMENT   Patient Name: Carlos Lloyd MRN: [id number] DOB:2001/01/11, 22 y.o., male Today's Date: 01/29/2023  END OF SESSION:  PT End of Session - 01/29/23 0955     Visit Number 4    Number of Visits 13    Date for PT Re-Evaluation 04/14/23    Authorization Type [employer] COMM PPO reporting period from 01/20/2023    PT Start Time 0954    PT Stop Time 1032    PT Time Calculation (min) 38 min    Activity Tolerance Patient tolerated treatment well    Behavior During Therapy [facility] for tasks assessed/performed                History reviewed. No pertinent past medical history. History reviewed. No pertinent surgical history. There are no problems to display for this patient.   PCP: Eden Lathe, MD  REFERRING PROVIDER: Noralee Stain, MD   REFERRING DIAG: rotator cuff syndrome of right shoulder  THERAPY DIAG:  Chronic right shoulder pain  Rationale for Evaluation and Treatment: Rehabilitation  ONSET DATE: approx 1 year prior to PT evaluation  PERTINENT HISTORY: Patient is a 22 y.o. male who presents to outpatient physical therapy with a referral for medical diagnosis rotator cuff syndrome of right shoulder. This patient's chief complaints consist of chronic right shoulder pain leading to the following functional deficits: difficulty with sleeping, ADLS (brushing teeth, shaving), lifting things like a couch, throwing a foot ball (overhand throwing), anything that requires using the right UE a lot, especially overhead. Relevant past medical history and comorbidities include none reported.  Patient denies hx of cancer, stroke, seizures, lung problems, heart problems, diabetes, unexplained weight loss, unexplained changes in bowel or bladder problems, unexplained stumbling or dropping things, osteoporosis, and spinal surgery  SUBJECTIVE:                                                                                                                                                                                       SUBJECTIVE STATEMENT:  Patient reports some soreness in the right shoulder after PT yesterday, but no pain.   PAIN:  NPRS: 0/10  PATIENT GOALS: "to be able to sleep on that side again" "be able to do simple tasks without pain" " and "be able to reach up in the air with my arm for a while without pain"  NEXT MD VISIT:    OBJECTIVE   TODAY'S TREATMENT:   Therapeutic exercise: to centralize symptoms and improve ROM, strength, muscular endurance, and activity tolerance required for successful completion of functional activities.  - Upper body ergometer level 9 to encourage joint nutrition, warm tissue, induce analgesic  effect of aerobic exercise, improve muscular strength and endurance,  and prepare for remainder of session. 6 min total, changing directions ever 1 min.  - standing reverse overhead throw with BlackTB anchored at floor, 3x15, each UE  - education about RPE and RIR for strength training - Resisted ER walkout with B UE using BlueTB 3x15   - Resisted band walks on wall with black TB 2x10 - side propped ER ball catch 3x1 minute, 1kg med ball  Pt required multimodal cuing for proper technique and to facilitate improved neuromuscular control, strength, range of motion, and functional ability resulting in improved performance and form.  PATIENT EDUCATION: Education details: Exercise purpose/form. Self management techniques. Education on diagnosis, prognosis, POC, anatomy and physiology of current condition. Education on HEP including handout. Person educated: Patient Education method: Explanation, Demonstration, and Handouts Education comprehension: verbalized understanding, returned demonstration, and needs further education  HOME EXERCISE PROGRAM: Access Code: [email or web address] URL: [email or web address] Date: 01/20/2023 Prepared by: Norton Blizzard  Exercises - Seated Shoulder External Rotation in  Abduction Supported with Dumbbell  - 1 x daily - 3 sets - 15 reps - Single Arm Scaption with Dumbbell  - 1 x daily - 3 sets - 15 reps  ASSESSMENT:  CLINICAL IMPRESSION:   Patient arrives reporting acceptable soreness consistent with DOMS after last PT session (yesterday). Today's session continued with similar exercises due to appropriate challenge level. Patient performed well with appropriate fatigue. Patient would benefit from continued management of limiting condition by skilled physical therapist to address remaining impairments and functional limitations to work towards stated goals and return to PLOF or maximal functional independence.   From Initial PT evaluation 01/20/2023: Patient is a 22 y.o. male referred to outpatient physical therapy with a medical diagnosis of rotator cuff syndrome of right shoulder who presents with signs and symptoms most consistent with chronic right shoulder pain, most likely rotator cuff related with less suspicion for labra tear. Patient demonstrates painful arc, pain/weakness with R shoulder resisted ER, positive hawkins-kennedy and popping/catching is not a chief compliant. He does appear to have slightly more laxity in the right GHJ compared to the left and pain in the posterior shoulder with anterior glide of GHJ while in maximum ER may suggest internal impingement. Positive O'brien's test is not corroborated well by other tests or symptoms. Patient would most likely benefit from stabilization and strengthening program for the right shoulder girdle with interventions for pain control and improved muscle function a needed. Patient presents with significant pain, ROM, joint mobility, muscle performance (strength/power/endurance), motor control, and activity tolerance impairments that are limiting ability to complete usual activities such a sleeping, ADLS (brushing teeth, shaving), lifting things like a couch, throwing a foot ball (overhand throwing), anything that  requires using the right UE a lot, especially overhead without difficulty. Patient will benefit from skilled physical therapy intervention to address current body structure impairments and activity limitations to improve function and work towards goals set in current POC in order to return to prior level of function or maximal functional improvement.   OBJECTIVE IMPAIRMENTS: decreased activity tolerance, decreased endurance, decreased knowledge of condition, decreased ROM, decreased strength, impaired perceived functional ability, increased muscle spasms, impaired UE functional use, and pain.   ACTIVITY LIMITATIONS: carrying, lifting, sleeping, reach over head, and hygiene/grooming  PARTICIPATION LIMITATIONS: community activity, occupation, school, and   difficulty with sleeping, ADLS (brushing teeth, shaving), lifting things like a couch, throwing a foot ball (overhand throwing), anything that requires using the right UE a  lot, especially overhead  PERSONAL FACTORS: Past/current experiences and Time since onset of injury/illness/exacerbation are also affecting patient's functional outcome.   REHAB POTENTIAL: Good  CLINICAL DECISION MAKING: Stable/uncomplicated  EVALUATION COMPLEXITY: Low   GOALS: Goals reviewed with patient? No  SHORT TERM GOALS: Target date: 02/03/2023  Patient will be independent with initial home exercise program for self-management of symptoms. Baseline: Initial HEP provided at IE (01/20/23); Goal status: In-progress   LONG TERM GOALS: Target date: 04/14/2023  Patient will be independent with a long-term home exercise program for self-management of symptoms.  Baseline: Initial HEP provided at IE (01/20/23); Goal status: In-progress  2.  Patient will demonstrate improved FOTO to equal or greater than 80 by visit #10 to demonstrate improvement in overall condition and self-reported functional ability.  Baseline: 62 (01/20/23); Goal status: In-progress  3.   Patient will demonstrate R shoulder strength equal or greater to L shoulder strength with no increase in pain to improve his ability to use R UE for daily tasks such as overhead work, throwing a football, and grooming tasks with less difficulty.  Baseline: R ER 4+/5 and painful (01/20/23); Goal status: In-progress  4.  Patient will demonstrate ability to perform 20 consecutive overhead throws at the rebounder with 1kg med ball using R UE without increased pain to improve his ability to play sports, perform repetative overhead activities, and throw a football.  Baseline: reports throwing a football overhand is painful (01/20/23); Goal status: In-progress  5.  Patient will report being able to sleep on his right side without being awoken by right shoulder pain to improve his ability to sleep comfortably.  Baseline: awoken by pain whenever he rolls on the right side (01/20/23); Goal status: In-progress   PLAN:  PT FREQUENCY: 1-2x/week  PT DURATION: 12 weeks  PLANNED INTERVENTIONS: Therapeutic exercises, Therapeutic activity, Neuromuscular re-education, Patient/Family education, Self Care, Joint mobilization, Dry Needling, Electrical stimulation, Spinal mobilization, Cryotherapy, Moist heat, Manual therapy, and Re-evaluation  PLAN FOR NEXT SESSION: update HEP as appropriate, progressive loading as tolerated to the shoulder girdle focusing on rotator cuff musculature, functional/UE strengthening/motor control exercises, manual therapy/dry needling as needed, education.    Cira Rue, PT, DPT 01/29/2023, 3:47 PM   [facility] [address] [place], [place] [address] P: [phone] I F: [phone]

## 2023-02-02 ENCOUNTER — Ambulatory Visit: Payer: BC Managed Care – PPO | Admitting: Physical Therapy

## 2023-02-05 ENCOUNTER — Ambulatory Visit: Payer: BLUE CROSS/BLUE SHIELD | Admitting: Physical Therapy

## 2023-02-05 ENCOUNTER — Encounter: Payer: Self-pay | Admitting: Physical Therapy

## 2023-02-09 ENCOUNTER — Ambulatory Visit: Payer: BC Managed Care – PPO | Admitting: Physical Therapy

## 2023-02-09 ENCOUNTER — Encounter: Payer: Self-pay | Admitting: Physical Therapy

## 2023-02-09 DIAGNOSIS — M25511 Pain in right shoulder: Secondary | ICD-10-CM | POA: Diagnosis not present

## 2023-02-09 DIAGNOSIS — G8929 Other chronic pain: Secondary | ICD-10-CM

## 2023-02-09 NOTE — Therapy (Signed)
OUTPATIENT PHYSICAL THERAPY TREATMENT   Patient Name: Carlos Lloyd MRN: [id number] DOB:[birth year], 22 y.o., male Today's Date: 02/09/2023  END OF SESSION:  PT End of Session - 02/09/23 0906     Visit Number 5    Number of Visits 13    Date for PT Re-Evaluation 04/14/23    Authorization Type [employer] COMM PPO reporting period from 01/20/2023    PT Start Time 0905    PT Stop Time 0946    PT Time Calculation (min) 41 min    Activity Tolerance Patient tolerated treatment well    Behavior During Therapy [facility] for tasks assessed/performed                 History reviewed. No pertinent past medical history. History reviewed. No pertinent surgical history. There are no problems to display for this patient.   PCP: Eden Lathe, MD  REFERRING PROVIDER: Noralee Stain, MD   REFERRING DIAG: rotator cuff syndrome of right shoulder  THERAPY DIAG:  Chronic right shoulder pain  Rationale for Evaluation and Treatment: Rehabilitation  ONSET DATE: approx 1 year prior to PT evaluation  PERTINENT HISTORY: Patient is a 22 y.o. male who presents to outpatient physical therapy with a referral for medical diagnosis rotator cuff syndrome of right shoulder. This patient's chief complaints consist of chronic right shoulder pain leading to the following functional deficits: difficulty with sleeping, ADLS (brushing teeth, shaving), lifting things like a couch, throwing a foot ball (overhand throwing), anything that requires using the right UE a lot, especially overhead. Relevant past medical history and comorbidities include none reported.  Patient denies hx of cancer, stroke, seizures, lung problems, heart problems, diabetes, unexplained weight loss, unexplained changes in bowel or bladder problems, unexplained stumbling or dropping things, osteoporosis, and spinal surgery  SUBJECTIVE:                                                                                                                                                                                       SUBJECTIVE STATEMENT:  Patient reports his shoulder continues to bother him at night. He states it is hurting him a little this morning. He reports 4/10 at the lateral right shoulder.   PAIN:  NPRS: 4/10 lateral right shoulder.   PATIENT GOALS: "to be able to sleep on that side again" "be able to do simple tasks without pain" " and "be able to reach up in the air with my arm for a while without pain"  NEXT MD VISIT:    OBJECTIVE  SELF-REPORTED FUNCTION FOTO score: 61/100 (shoulder questionnaire)    TODAY'S TREATMENT:   Therapeutic exercise: to centralize symptoms and improve ROM,  strength, muscular endurance, and activity tolerance required for successful completion of functional activities.  - Upper body ergometer level 7 to encourage joint nutrition, warm tissue, induce analgesic effect of aerobic exercise, improve muscular strength and endurance,  and prepare for remainder of session. 6 min total, changing directions ever 1 min.  (Manual therapy / dry needling - see below).  - sidelying R modified sleeper stretch for shoulder IR, 3x30 seconds. - seated R shoulder ER with 10#DB, 1x10, patient showing set up at approx 70 degrees scaption - seated R shoulder ER with elbow propped on knee at approximately 85 degrees scaption, 3x10/9/9 (difficult, no painful) - bosu arm pumps in plank position, 4x30 seconds, flat side of bosu up.  - plank to opposite ankle reach, 1x10 each side (fairly easy)  - stir the pot on clear theraball, 1x10 each direction CW/CCW - plank to pike/knees to chest, 1x10 with clear theraball - front plank <> side plank reaching under body <> up to ceiling with R hand planted, 1x10.  - Education on HEP including handout   Manual therapy: to reduce pain and tissue tension, improve range of motion, neuromodulation, in order to promote improved ability to complete functional activities. PRONE - STM to  right infraspinatus region  Modality: Dry needling performed to right infraspinatus to decrease pain and spasms along patient's right shoulder region with patient in prone utilizing 1 dry needle(s) .25mm x 40mm with 1 sticks at right infraspinatus. Patient educated about the risks and benefits from therapy and verbally consents to treatment. Multiple strong twitch responses noted.  Dry needling performed by Luretha Murphy. Ilsa Iha PT, DPT who is certified in this technique.  Pt required multimodal cuing for proper technique and to facilitate improved neuromuscular control, strength, range of motion, and functional ability resulting in improved performance and form.  PATIENT EDUCATION: Education details: Exercise purpose/form. Self management techniques. Education on diagnosis, prognosis, POC, anatomy and physiology of current condition. Education on HEP including handout. Person educated: Patient Education method: Explanation, Demonstration, and Handouts Education comprehension: verbalized understanding, returned demonstration, and needs further education  HOME EXERCISE PROGRAM: Access Code: [email or web address] URL: [email or web address] Date: 02/09/2023 Prepared by: Norton Blizzard  Exercises - Single Arm Scaption with Dumbbell  - 1 x daily - 3 sets - 15 reps - Seated Shoulder External Rotation in Abduction Supported with Dumbbell  - 3-5 x weekly - 3 sets - 9-15 reps - Side Plank on Full Arm with Rotation  - 3-5 x weekly - 3 sets - 10 reps - Prone on Swiss Ball Circles  - 3-5 x weekly - 3 sets - 10 reps  ASSESSMENT:  CLINICAL IMPRESSION:   Patient arrives reporting elevated pain today and registered improvement in FOTO score since starting PT.  Today's session included dry needling to right infraspinatus to help with pain control; strong twitch response noted. Progressed HEP volume and introduced CKC exercises to continue improving joint stability and motor control. Patient tolerated exercises  well and reported decreased pain by end of session. Patient would benefit from continued management of limiting condition by skilled physical therapist to address remaining impairments and functional limitations to work towards stated goals and return to PLOF or maximal functional independence.    From Initial PT evaluation 01/20/2023: Patient is a 22 y.o. male referred to outpatient physical therapy with a medical diagnosis of rotator cuff syndrome of right shoulder who presents with signs and symptoms most consistent with chronic right shoulder pain, most likely rotator cuff related with less suspicion  for labra tear. Patient demonstrates painful arc, pain/weakness with R shoulder resisted ER, positive hawkins-kennedy and popping/catching is not a chief compliant. He does appear to have slightly more laxity in the right GHJ compared to the left and pain in the posterior shoulder with anterior glide of GHJ while in maximum ER may suggest internal impingement. Positive O'brien's test is not corroborated well by other tests or symptoms. Patient would most likely benefit from stabilization and strengthening program for the right shoulder girdle with interventions for pain control and improved muscle function a needed. Patient presents with significant pain, ROM, joint mobility, muscle performance (strength/power/endurance), motor control, and activity tolerance impairments that are limiting ability to complete usual activities such a sleeping, ADLS (brushing teeth, shaving), lifting things like a couch, throwing a foot ball (overhand throwing), anything that requires using the right UE a lot, especially overhead without difficulty. Patient will benefit from skilled physical therapy intervention to address current body structure impairments and activity limitations to improve function and work towards goals set in current POC in order to return to prior level of function or maximal functional improvement.    OBJECTIVE IMPAIRMENTS: decreased activity tolerance, decreased endurance, decreased knowledge of condition, decreased ROM, decreased strength, impaired perceived functional ability, increased muscle spasms, impaired UE functional use, and pain.   ACTIVITY LIMITATIONS: carrying, lifting, sleeping, reach over head, and hygiene/grooming  PARTICIPATION LIMITATIONS: community activity, occupation, school, and   difficulty with sleeping, ADLS (brushing teeth, shaving), lifting things like a couch, throwing a foot ball (overhand throwing), anything that requires using the right UE a lot, especially overhead  PERSONAL FACTORS: Past/current experiences and Time since onset of injury/illness/exacerbation are also affecting patient's functional outcome.   REHAB POTENTIAL: Good  CLINICAL DECISION MAKING: Stable/uncomplicated  EVALUATION COMPLEXITY: Low   GOALS: Goals reviewed with patient? No  SHORT TERM GOALS: Target date: 02/03/2023  Patient will be independent with initial home exercise program for self-management of symptoms. Baseline: Initial HEP provided at IE (01/20/23); Goal status: In-progress   LONG TERM GOALS: Target date: 04/14/2023  Patient will be independent with a long-term home exercise program for self-management of symptoms.  Baseline: Initial HEP provided at IE (01/20/23); Goal status: In-progress  2.  Patient will demonstrate improved FOTO to equal or greater than 80 by visit #10 to demonstrate improvement in overall condition and self-reported functional ability.  Baseline: 62 (01/20/23); 61 at visit #5 (02/09/2023);  Goal status: In-progress  3.  Patient will demonstrate R shoulder strength equal or greater to L shoulder strength with no increase in pain to improve his ability to use R UE for daily tasks such as overhead work, throwing a football, and grooming tasks with less difficulty.  Baseline: R ER 4+/5 and painful (01/20/23); Goal status: In-progress  4.   Patient will demonstrate ability to perform 20 consecutive overhead throws at the rebounder with 1kg med ball using R UE without increased pain to improve his ability to play sports, perform repetative overhead activities, and throw a football.  Baseline: reports throwing a football overhand is painful (01/20/23); Goal status: In-progress  5.  Patient will report being able to sleep on his right side without being awoken by right shoulder pain to improve his ability to sleep comfortably.  Baseline: awoken by pain whenever he rolls on the right side (01/20/23); Goal status: In-progress   PLAN:  PT FREQUENCY: 1-2x/week  PT DURATION: 12 weeks  PLANNED INTERVENTIONS: Therapeutic exercises, Therapeutic activity, Neuromuscular re-education, Patient/Family education, Self Care, Joint mobilization,  Dry Needling, Electrical stimulation, Spinal mobilization, Cryotherapy, Moist heat, Manual therapy, and Re-evaluation  PLAN FOR NEXT SESSION: update HEP as appropriate, progressive loading as tolerated to the shoulder girdle focusing on rotator cuff musculature, functional/UE strengthening/motor control exercises, manual therapy/dry needling as needed, education.    Cira Rue, PT, DPT 02/09/2023, 9:55 AM   [facility] [address] [place], [place] [address] P: [phone] I F: [phone]

## 2023-02-11 ENCOUNTER — Encounter: Payer: BLUE CROSS/BLUE SHIELD | Admitting: Physical Therapy

## 2023-02-17 ENCOUNTER — Encounter: Payer: Self-pay | Admitting: Physical Therapy

## 2023-02-17 ENCOUNTER — Ambulatory Visit: Payer: BC Managed Care – PPO | Admitting: Physical Therapy

## 2023-02-17 DIAGNOSIS — G8929 Other chronic pain: Secondary | ICD-10-CM

## 2023-02-17 DIAGNOSIS — M25511 Pain in right shoulder: Secondary | ICD-10-CM | POA: Diagnosis not present

## 2023-02-17 NOTE — Therapy (Incomplete)
OUTPATIENT PHYSICAL THERAPY TREATMENT   Patient Name: Carlos Lloyd MRN: [id number] DOB:[birth year], 22 y.o., male Today's Date: 02/18/2023  END OF SESSION:  PT End of Session - 02/17/23 1747     Visit Number 6    Number of Visits 13    Date for PT Re-Evaluation 04/14/23    Authorization Type [employer] COMM PPO reporting period from 01/20/2023    PT Start Time 1740    PT Stop Time 1823    PT Time Calculation (min) 43 min    Activity Tolerance Patient tolerated treatment well    Behavior During Therapy [facility] for tasks assessed/performed                  History reviewed. No pertinent past medical history. History reviewed. No pertinent surgical history. There are no problems to display for this patient.   PCP: Eden Lathe, MD  REFERRING PROVIDER: Noralee Stain, MD   REFERRING DIAG: rotator cuff syndrome of right shoulder  THERAPY DIAG:  Chronic right shoulder pain  Rationale for Evaluation and Treatment: Rehabilitation  ONSET DATE: approx 1 year prior to PT evaluation  PERTINENT HISTORY: Patient is a 22 y.o. male who presents to outpatient physical therapy with a referral for medical diagnosis rotator cuff syndrome of right shoulder. This patient's chief complaints consist of chronic right shoulder pain leading to the following functional deficits: difficulty with sleeping, ADLS (brushing teeth, shaving), lifting things like a couch, throwing a foot ball (overhand throwing), anything that requires using the right UE a lot, especially overhead. Relevant past medical history and comorbidities include none reported.  Patient denies hx of cancer, stroke, seizures, lung problems, heart problems, diabetes, unexplained weight loss, unexplained changes in bowel or bladder problems, unexplained stumbling or dropping things, osteoporosis, and spinal surgery  SUBJECTIVE:                                                                                                                                                                                       SUBJECTIVE STATEMENT:  Patient reports that pain is unchanged since last visit.  Pain bothers him at night. He reports that last night he fell asleep on that side leading to increased pain.  PAIN:  NPRS: 2/10 lateral right shoulder.   PATIENT GOALS: "to be able to sleep on that side again" "be able to do simple tasks without pain" " and "be able to reach up in the air with my arm for a while without pain"  NEXT MD VISIT:    OBJECTIVE    TODAY'S TREATMENT:   Therapeutic exercise: to centralize symptoms and improve ROM, strength, muscular endurance, and activity tolerance  required for successful completion of functional activities.  - Upper body ergometer level 9 to encourage joint nutrition, warm tissue, induce analgesic effect of aerobic exercise, improve muscular strength and endurance,  and prepare for remainder of session. 5 min total, changing directions ever 1 min.  (Manual therapy / dry needling - see below).  - Supine Cross Body stretch with IR contract-relax (MET), 5-6 reps with 3-4 second hold - seated shoulder ER with elbow propped on knee at approximately 85 degrees scaption, 3x15 each side(difficult, no painful) - Lawnmower row: bodyweight with cues for technique (using mirror), 12 reps on R side with 8# DB, 12 reps on R side with 10# DB. Several more reps with AROM with cuing to improve form.   - Manual therapy: to reduce pain and tissue tension, improve range of motion, neuromodulation, in order to promote improved ability to complete functional activities. PRONE - STM to right infraspinatus/teres minor region including sustained pressure on posterior rotator cuff with AROM ER/IR.   Modality: Dry needling performed to right infraspinatus to decrease pain and spasms along patient's right shoulder region with patient in prone utilizing 1 dry needle(s) .25mm x 60mm with 1 sticks at right infraspinatus.  Patient educated about the risks and benefits from therapy and verbally consents to treatment. Multiple twitch responses noted.  Dry needling performed by Luretha Murphy. Ilsa Iha PT, DPT who is certified in this technique.  Pt required multimodal cuing for proper technique and to facilitate improved neuromuscular control, strength, range of motion, and functional ability resulting in improved performance and form.  PATIENT EDUCATION: Education details: Exercise purpose/form. Self management techniques. Education on diagnosis, prognosis, POC, anatomy and physiology of current condition. Education on HEP including handout. Person educated: Patient Education method: Explanation, Demonstration, and Handouts Education comprehension: verbalized understanding, returned demonstration, and needs further education  HOME EXERCISE PROGRAM: Access Code: [email or web address] URL: [email or web address] Date: 02/09/2023 Prepared by: Norton Blizzard  Exercises - Single Arm Scaption with Dumbbell  - 1 x daily - 3 sets - 15 reps - Seated Shoulder External Rotation in Abduction Supported with Dumbbell  - 3-5 x weekly - 3 sets - 9-15 reps - Side Plank on Full Arm with Rotation  - 3-5 x weekly - 3 sets - 10 reps - Prone on Swiss Ball Circles  - 3-5 x weekly - 3 sets - 10 reps  ASSESSMENT:  CLINICAL IMPRESSION:   Patient arrives with no change in pain since last PT session but thought dry needling was helpful. Continued with dry needling and soft tissue mobilization in the posterior rotator cuff followed by stretching and strengthening of the region. Plan to continue with loading and strength/stabilization interventions as tolerated next session. Patient would benefit from continued management of limiting condition by skilled physical therapist to address remaining impairments and functional limitations to work towards stated goals and return to PLOF or maximal functional independence.   From Initial PT evaluation  01/20/2023: Patient is a 22 y.o. male referred to outpatient physical therapy with a medical diagnosis of rotator cuff syndrome of right shoulder who presents with signs and symptoms most consistent with chronic right shoulder pain, most likely rotator cuff related with less suspicion for labra tear. Patient demonstrates painful arc, pain/weakness with R shoulder resisted ER, positive hawkins-kennedy and popping/catching is not a chief compliant. He does appear to have slightly more laxity in the right GHJ compared to the left and pain in the posterior shoulder with anterior glide of GHJ while in maximum ER may suggest  internal impingement. Positive O'brien's test is not corroborated well by other tests or symptoms. Patient would most likely benefit from stabilization and strengthening program for the right shoulder girdle with interventions for pain control and improved muscle function a needed. Patient presents with significant pain, ROM, joint mobility, muscle performance (strength/power/endurance), motor control, and activity tolerance impairments that are limiting ability to complete usual activities such a sleeping, ADLS (brushing teeth, shaving), lifting things like a couch, throwing a foot ball (overhand throwing), anything that requires using the right UE a lot, especially overhead without difficulty. Patient will benefit from skilled physical therapy intervention to address current body structure impairments and activity limitations to improve function and work towards goals set in current POC in order to return to prior level of function or maximal functional improvement.   OBJECTIVE IMPAIRMENTS: decreased activity tolerance, decreased endurance, decreased knowledge of condition, decreased ROM, decreased strength, impaired perceived functional ability, increased muscle spasms, impaired UE functional use, and pain.   ACTIVITY LIMITATIONS: carrying, lifting, sleeping, reach over head, and  hygiene/grooming  PARTICIPATION LIMITATIONS: community activity, occupation, school, and   difficulty with sleeping, ADLS (brushing teeth, shaving), lifting things like a couch, throwing a foot ball (overhand throwing), anything that requires using the right UE a lot, especially overhead  PERSONAL FACTORS: Past/current experiences and Time since onset of injury/illness/exacerbation are also affecting patient's functional outcome.   REHAB POTENTIAL: Good  CLINICAL DECISION MAKING: Stable/uncomplicated  EVALUATION COMPLEXITY: Low   GOALS: Goals reviewed with patient? No  SHORT TERM GOALS: Target date: 02/03/2023  Patient will be independent with initial home exercise program for self-management of symptoms. Baseline: Initial HEP provided at IE (01/20/23); Goal status: In-progress   LONG TERM GOALS: Target date: 04/14/2023  Patient will be independent with a long-term home exercise program for self-management of symptoms.  Baseline: Initial HEP provided at IE (01/20/23); Goal status: In-progress  2.  Patient will demonstrate improved FOTO to equal or greater than 80 by visit #10 to demonstrate improvement in overall condition and self-reported functional ability.  Baseline: 62 (01/20/23); 61 at visit #5 (02/09/2023);  Goal status: In-progress  3.  Patient will demonstrate R shoulder strength equal or greater to L shoulder strength with no increase in pain to improve his ability to use R UE for daily tasks such as overhead work, throwing a football, and grooming tasks with less difficulty.  Baseline: R ER 4+/5 and painful (01/20/23); Goal status: In-progress  4.  Patient will demonstrate ability to perform 20 consecutive overhead throws at the rebounder with 1kg med ball using R UE without increased pain to improve his ability to play sports, perform repetative overhead activities, and throw a football.  Baseline: reports throwing a football overhand is painful (01/20/23); Goal  status: In-progress  5.  Patient will report being able to sleep on his right side without being awoken by right shoulder pain to improve his ability to sleep comfortably.  Baseline: awoken by pain whenever he rolls on the right side (01/20/23); Goal status: In-progress   PLAN:  PT FREQUENCY: 1-2x/week  PT DURATION: 12 weeks  PLANNED INTERVENTIONS: Therapeutic exercises, Therapeutic activity, Neuromuscular re-education, Patient/Family education, Self Care, Joint mobilization, Dry Needling, Electrical stimulation, Spinal mobilization, Cryotherapy, Moist heat, Manual therapy, and Re-evaluation  PLAN FOR NEXT SESSION: update HEP as appropriate, progressive loading as tolerated to the shoulder girdle focusing on rotator cuff musculature, functional/UE strengthening/motor control exercises, manual therapy/dry needling as needed, education.    Matthew Saras, Student-PT, DPT 02/17/2023, 6:28 PM  Luretha Murphy. Ilsa Iha, PT, DPT 02/18/23, 9:18 PM  [facility] [address] [place], [place] [address] P: [phone] I F: [phone]

## 2023-02-19 ENCOUNTER — Encounter: Payer: BLUE CROSS/BLUE SHIELD | Admitting: Physical Therapy

## 2023-02-24 ENCOUNTER — Ambulatory Visit: Payer: BC Managed Care – PPO | Admitting: Physical Therapy

## 2023-02-24 ENCOUNTER — Encounter: Payer: Self-pay | Admitting: Physical Therapy

## 2023-02-24 DIAGNOSIS — G8929 Other chronic pain: Secondary | ICD-10-CM

## 2023-02-24 DIAGNOSIS — M25511 Pain in right shoulder: Secondary | ICD-10-CM | POA: Diagnosis not present

## 2023-02-24 NOTE — Therapy (Addendum)
OUTPATIENT PHYSICAL THERAPY TREATMENT   Patient Name: Carlos Lloyd MRN: [id number] DOB:07/02/2000, 22 y.o., male Today's Date: 02/24/2023  END OF SESSION:  PT End of Session - 02/24/23 2020     Visit Number 7    Number of Visits 13    Date for PT Re-Evaluation 04/14/23    Authorization Type [employer] COMM PPO reporting period from 01/20/2023    PT Start Time 1730    PT Stop Time 1820    PT Time Calculation (min) 50 min    Activity Tolerance Patient tolerated treatment well    Behavior During Therapy [facility] for tasks assessed/performed             History reviewed. No pertinent past medical history. History reviewed. No pertinent surgical history. There are no problems to display for this patient.   PCP: Eden Lathe, MD  REFERRING PROVIDER: Noralee Stain, MD   REFERRING DIAG: rotator cuff syndrome of right shoulder  THERAPY DIAG:  Chronic right shoulder pain  Rationale for Evaluation and Treatment: Rehabilitation  ONSET DATE: approx 1 year prior to PT evaluation  PERTINENT HISTORY: Patient is a 22 y.o. male who presents to outpatient physical therapy with a referral for medical diagnosis rotator cuff syndrome of right shoulder. This patient's chief complaints consist of chronic right shoulder pain leading to the following functional deficits: difficulty with sleeping, ADLS (brushing teeth, shaving), lifting things like a couch, throwing a foot ball (overhand throwing), anything that requires using the right UE a lot, especially overhead. Relevant past medical history and comorbidities include none reported.  Patient denies hx of cancer, stroke, seizures, lung problems, heart problems, diabetes, unexplained weight loss, unexplained changes in bowel or bladder problems, unexplained stumbling or dropping things, osteoporosis, and spinal surgery  SUBJECTIVE:                                                                                                                                                                                       SUBJECTIVE STATEMENT:  Patient reports that pain is unchanged since last visit.  He stated that he feels stronger, even though there is no change in his pain. He also stated that we works out at the [employer] 2-3x/week with upper body machine exercises such as chest presses and shoulder presses. He reports that he typically does his chest press with 90 lbs.  PAIN:  NPRS: 2/10 lateral right shoulder.   PATIENT GOALS: "to be able to sleep on that side again" "be able to do simple tasks without pain" " and "be able to reach up in the air with my arm for a while without pain"  NEXT  MD VISIT:    OBJECTIVE    TODAY'S TREATMENT:   Therapeutic exercise: to centralize symptoms and improve ROM, strength, muscular endurance, and activity tolerance required for successful completion of functional activities.  - Upper body ergometer level 9 to encourage joint nutrition, warm tissue, induce analgesic effect of aerobic exercise, improve muscular strength and endurance,  and prepare for remainder of session. 5 min total, changing directions ever 1 min.   2 rounds of the following with 30-60 seconds between exercises  -Scapular clocks on wall with blue band and opposite UE iso stab - 4x around the clock, 8 reps  -Push up + with cues to drive through the ground for serratus anterior: 15 reps    -Appx 60-90 seconds rest between supersets  2 rounds of the following with 30-60 seconds between exercises  -Renegade rows with 8 lbs weights x12/side (1 set in push up position, but modified to quadruped due to difficulty keeping the serratus push  through the floor - total was 3 rounds of this. 1 round in push-up position and 2 rounds in quadruped)  - Scaption 10 lbs - 12 reps  -Appx 60-90 seconds rest between supersets  2 rounds of the following with 30-60 seconds between exercises  -Face pulls x10 with 5 lbs, x15 with 10 lbs  -Cable IR  (right side) x12 with 5 lbs, x15 with 10 lbs  Pt was given an alternating DB chest press with 20 lbs to do at home, emphasizing isometric serratus anterior engagement driving arm to the ceiling, while pressing with the other arm. He was instructed to do 2x10 reps/side.  PATIENT EDUCATION: Education details: Exercise purpose/form. Self management techniques. Education on diagnosis, prognosis, POC, anatomy and physiology of current condition. Education on HEP including handout. Person educated: Patient Education method: Explanation, Demonstration, and Handouts Education comprehension: verbalized understanding, returned demonstration, and needs further education  HOME EXERCISE PROGRAM: Access Code: [email or web address] URL: [email or web address] Date: 02/24/2023 Prepared by: Norton Blizzard  Exercises - Scapular clocks on wall - 3-4 x weekly - 2 sets - 4 x around clock - 8 reps bilat - Renegade row - 3-4 x weekly - 2 sets - 12 reps/side - Push Up with Plus  - 3-4 x weekly - 2 sets - 15 reps - 2 hold - Standing Shoulder Scaption  - 3-4 x weekly - 2 sets - 15 reps - 10 lbs weight - Standing Cable Shoulder Internal Rotation  - 3-4 x weekly - 2 sets - 15 reps - cable 10 lbs weight - Face Pulls  - 2 sets - 15 reps - 10 lbs weight - Supine Chest Press with Dumbbells  - 3-4 x weekly - 2 sets - 8 reps  ASSESSMENT:  CLINICAL IMPRESSION:    Patient arrives with no change in pain since last PT session but thought dry needling was helpful. Despite feeling stronger, he did not experience any pain reduction. Additional higher-level exercises were added to Pts program today, which he tolerated well.  There was an increased emphasis on scapular stabilization and serratus anterior strengthening with exercises such as the push +, scapular clock, and renegade rows.  Pt struggled to keep his serratus anterior engaged and frequently experienced scapular winging bilaterally, especially with WB exercises.  He  denied pain, but he reported a lot of fatigue especially in the newer exercises. Since the pt works out at [employer] 2-3x/week with heavier weights, it is likely that his periscaps and RTC muscles do not have the strength and  endurance to stabilize his shoulder optimally during workouts.  His RTC exercises were progressed in weight and/or complexity, and his rest periods were decreased to emphasize increased muscular endurance to help with the muscle fatigue.  His HEP was progressed to reflect the exercise progressions from the PT session.  Patient would benefit from continued management of limiting condition by skilled physical therapist to address remaining impairments and functional limitations to work towards stated goals and return to PLOF or maximal functional independence.   From Initial PT evaluation 01/20/2023: Patient is a 22 y.o. male referred to outpatient physical therapy with a medical diagnosis of rotator cuff syndrome of right shoulder who presents with signs and symptoms most consistent with chronic right shoulder pain, most likely rotator cuff related with less suspicion for labra tear. Patient demonstrates painful arc, pain/weakness with R shoulder resisted ER, positive hawkins-kennedy and popping/catching is not a chief compliant. He does appear to have slightly more laxity in the right GHJ compared to the left and pain in the posterior shoulder with anterior glide of GHJ while in maximum ER may suggest internal impingement. Positive O'brien's test is not corroborated well by other tests or symptoms. Patient would most likely benefit from stabilization and strengthening program for the right shoulder girdle with interventions for pain control and improved muscle function a needed. Patient presents with significant pain, ROM, joint mobility, muscle performance (strength/power/endurance), motor control, and activity tolerance impairments that are limiting ability to complete usual activities such a  sleeping, ADLS (brushing teeth, shaving), lifting things like a couch, throwing a foot ball (overhand throwing), anything that requires using the right UE a lot, especially overhead without difficulty. Patient will benefit from skilled physical therapy intervention to address current body structure impairments and activity limitations to improve function and work towards goals set in current POC in order to return to prior level of function or maximal functional improvement.   OBJECTIVE IMPAIRMENTS: decreased activity tolerance, decreased endurance, decreased knowledge of condition, decreased ROM, decreased strength, impaired perceived functional ability, increased muscle spasms, impaired UE functional use, and pain.   ACTIVITY LIMITATIONS: carrying, lifting, sleeping, reach over head, and hygiene/grooming  PARTICIPATION LIMITATIONS: community activity, occupation, school, and   difficulty with sleeping, ADLS (brushing teeth, shaving), lifting things like a couch, throwing a foot ball (overhand throwing), anything that requires using the right UE a lot, especially overhead  PERSONAL FACTORS: Past/current experiences and Time since onset of injury/illness/exacerbation are also affecting patient's functional outcome.   REHAB POTENTIAL: Good  CLINICAL DECISION MAKING: Stable/uncomplicated  EVALUATION COMPLEXITY: Low   GOALS: Goals reviewed with patient? No  SHORT TERM GOALS: Target date: 02/03/2023  Patient will be independent with initial home exercise program for self-management of symptoms. Baseline: Initial HEP provided at IE (01/20/23); Goal status: In-progress   LONG TERM GOALS: Target date: 04/14/2023  Patient will be independent with a long-term home exercise program for self-management of symptoms.  Baseline: Initial HEP provided at IE (01/20/23); Goal status: In-progress  2.  Patient will demonstrate improved FOTO to equal or greater than 80 by visit #10 to demonstrate  improvement in overall condition and self-reported functional ability.  Baseline: 62 (01/20/23); 61 at visit #5 (02/09/2023);  Goal status: In-progress  3.  Patient will demonstrate R shoulder strength equal or greater to L shoulder strength with no increase in pain to improve his ability to use R UE for daily tasks such as overhead work, throwing a football, and grooming tasks with less difficulty.  Baseline: R  ER 4+/5 and painful (01/20/23); Goal status: In-progress  4.  Patient will demonstrate ability to perform 20 consecutive overhead throws at the rebounder with 1kg med ball using R UE without increased pain to improve his ability to play sports, perform repetative overhead activities, and throw a football.  Baseline: reports throwing a football overhand is painful (01/20/23); Goal status: In-progress  5.  Patient will report being able to sleep on his right side without being awoken by right shoulder pain to improve his ability to sleep comfortably.  Baseline: awoken by pain whenever he rolls on the right side (01/20/23); Goal status: In-progress   PLAN:  PT FREQUENCY: 1-2x/week  PT DURATION: 12 weeks  PLANNED INTERVENTIONS: Therapeutic exercises, Therapeutic activity, Neuromuscular re-education, Patient/Family education, Self Care, Joint mobilization, Dry Needling, Electrical stimulation, Spinal mobilization, Cryotherapy, Moist heat, Manual therapy, and Re-evaluation  PLAN FOR NEXT SESSION: update HEP as appropriate, progressive loading as tolerated to the shoulder girdle focusing on rotator cuff musculature, functional/UE strengthening/motor control exercises, manual therapy/dry needling as needed, education.   Goal: Increase scapular stability before rotator cuff strength.   Consider the following:  Progress exercises to 3 sets Watch his chest press from the HEP Shoulder flexion and abd in plank position (abd with sliders) Ts, Ys, Is Seated shoulder ER with elbow propped  on knee at approximately 85 degrees scaption, 3x15 each side(difficult, no painful) Horiz abd (band pull apart)      Matthew Saras, Student-PT, DPT 02/17/2023, 6:28 PM   Luretha Murphy. Ilsa Iha, PT, DPT 02/24/23, 8:20 PM  [facility] [address] [place], [place] [address] P: [phone] I F: [phone]

## 2023-03-05 ENCOUNTER — Encounter: Payer: Self-pay | Admitting: Physical Therapy

## 2023-03-05 ENCOUNTER — Ambulatory Visit: Payer: BC Managed Care – PPO | Attending: Family Medicine | Admitting: Physical Therapy

## 2023-03-05 DIAGNOSIS — M25511 Pain in right shoulder: Secondary | ICD-10-CM | POA: Insufficient documentation

## 2023-03-05 DIAGNOSIS — G8929 Other chronic pain: Secondary | ICD-10-CM | POA: Insufficient documentation

## 2023-03-05 NOTE — Therapy (Addendum)
OUTPATIENT PHYSICAL THERAPY TREATMENT   Patient Name: Carlos Lloyd MRN: [id number] DOB:[birth year], 22 y.o., male Today's Date: 03/05/2023  END OF SESSION:  PT End of Session - 03/05/23 1303     Visit Number 8    Number of Visits 13    Date for PT Re-Evaluation 04/14/23    Authorization Type BCBS COMM PPO reporting period from 01/20/2023    PT Start Time 1302    PT Stop Time 1345    PT Time Calculation (min) 43 min    Activity Tolerance Patient tolerated treatment well    Behavior During Therapy [facility] for tasks assessed/performed              History reviewed. No pertinent past medical history. History reviewed. No pertinent surgical history. There are no problems to display for this patient.   PCP: Eden Lathe, MD  REFERRING PROVIDER: Noralee Stain, MD   REFERRING DIAG: rotator cuff syndrome of right shoulder  THERAPY DIAG:  Chronic right shoulder pain  Rationale for Evaluation and Treatment: Rehabilitation  ONSET DATE: approx 1 year prior to PT evaluation  PERTINENT HISTORY: Patient is a 22 y.o. male who presents to outpatient physical therapy with a referral for medical diagnosis rotator cuff syndrome of right shoulder. This patient's chief complaints consist of chronic right shoulder pain leading to the following functional deficits: difficulty with sleeping, ADLS (brushing teeth, shaving), lifting things like a couch, throwing a foot ball (overhand throwing), anything that requires using the right UE a lot, especially overhead. Relevant past medical history and comorbidities include none reported.  Patient denies hx of cancer, stroke, seizures, lung problems, heart problems, diabetes, unexplained weight loss, unexplained changes in bowel or bladder problems, unexplained stumbling or dropping things, osteoporosis, and spinal surgery  SUBJECTIVE:                                                                                                                                                                                       SUBJECTIVE STATEMENT:  Patient reports that pain is better than last time. He liked the chest press variation.  Not that sore after last time.  He has doing the exercises 3-4 times per week.    PAIN:  NPRS: 1.5/10 lateral right shoulder.   PATIENT GOALS: "to be able to sleep on that side again" "be able to do simple tasks without pain" " and "be able to reach up in the air with my arm for a while without pain"  NEXT MD VISIT:    OBJECTIVE    TODAY'S TREATMENT:   Therapeutic exercise: to centralize symptoms and improve ROM, strength, muscular endurance, and activity tolerance  required for successful completion of functional activities.  - Upper body ergometer level 9 to encourage joint nutrition, warm tissue, induce analgesic effect of aerobic exercise, improve muscular strength and endurance,  and prepare for remainder of session. 5 min total, changing directions ever 1 min.   2 rounds of the following with 30-60 seconds between exercises and 90-120 seconds between super-sets  - Scapular clocks on wall with blue band and opposite UE iso stab - 4x around the clock, 10 reps  - Staggered Push up + with cues to drive through the ground for serratus anterior: 8 reps per side  2 rounds of the following with 30-60 seconds between exercises   -Face pulls: x10 with lbs, x10 (round 1), with OH press (round 2)  -Cable IR 15 lbs x9 (both sides), x12 10 lbs (both sides)  2 rounds of the following  -Renegade rows push up position x12/side: 10 lbs  - Dumbbell D2 flex: x8 with 10 lbs, x10 with 7 lbs  Alternating chest press +: 2x10 per side with contralateral dumbbell hold in protracted position  PATIENT EDUCATION: Education details: Exercise purpose/form. Self management techniques. Education on diagnosis, prognosis, POC, anatomy and physiology of current condition. Education on HEP including handout. Person educated:  Patient Education method: Explanation, Demonstration, and Handouts Education comprehension: verbalized understanding, returned demonstration, and needs further education  HOME EXERCISE PROGRAM: Access Code: [email or web address] URL: [email or web address] Date: 03/05/2023 Prepared by: Norton Blizzard  Exercises - Scapular clocks on wall - 3-4 x weekly - 2 sets - 4 x around clock - 8 reps bilat - Renegade row - 3-4 x weekly - 2 sets - 12 reps/side - Staggered Push Up with Plus  - 3-4 x weekly - 2 sets - 15 reps - 2 hold - Standing Cable Shoulder Internal Rotation  - 3-4 x weekly - 2 sets - 15 reps - cable 10 lbs weight - Face Pulls to OH press - 2 sets - 15 reps - 15 lbs weight - Supine Chest Press with Dumbbells  - 3-4 x weekly - 2 sets - 10 reps with 20 lbs dumbbell - Shoulder PNF D2 Flexion  - 3-4 x weekly - 2 sets - 7 lbs weight -  8-10 reps  ASSESSMENT:  CLINICAL IMPRESSION:    Patient arrives with decreased pain since last session, despite having been at the same level for several weeks prior. He is tolerating the more advanced serratus and scapular stability exercises well with improved form from last week. He was able to progress the push up + from quadruped to standard push up position without losing his serratus engagement. He was also able to increase reps on some exercise and tolerate some progressions.  Plan for next session is to add some overhead stability exercises while continuing to progress his current routine and blend the exercises into his current strength training routine at the gym.   Patient would benefit from continued management of limiting condition by skilled physical therapist to address remaining impairments and functional limitations to work towards stated goals and return to PLOF or maximal functional independence.    From Initial PT evaluation 01/20/2023: Patient is a 22 y.o. male referred to outpatient physical therapy with a medical diagnosis of rotator cuff  syndrome of right shoulder who presents with signs and symptoms most consistent with chronic right shoulder pain, most likely rotator cuff related with less suspicion for labra tear. Patient demonstrates painful arc, pain/weakness with R shoulder resisted ER, positive hawkins-kennedy and popping/catching is  not a chief compliant. He does appear to have slightly more laxity in the right GHJ compared to the left and pain in the posterior shoulder with anterior glide of GHJ while in maximum ER may suggest internal impingement. Positive O'brien's test is not corroborated well by other tests or symptoms. Patient would most likely benefit from stabilization and strengthening program for the right shoulder girdle with interventions for pain control and improved muscle function a needed. Patient presents with significant pain, ROM, joint mobility, muscle performance (strength/power/endurance), motor control, and activity tolerance impairments that are limiting ability to complete usual activities such a sleeping, ADLS (brushing teeth, shaving), lifting things like a couch, throwing a foot ball (overhand throwing), anything that requires using the right UE a lot, especially overhead without difficulty. Patient will benefit from skilled physical therapy intervention to address current body structure impairments and activity limitations to improve function and work towards goals set in current POC in order to return to prior level of function or maximal functional improvement.   OBJECTIVE IMPAIRMENTS: decreased activity tolerance, decreased endurance, decreased knowledge of condition, decreased ROM, decreased strength, impaired perceived functional ability, increased muscle spasms, impaired UE functional use, and pain.   ACTIVITY LIMITATIONS: carrying, lifting, sleeping, reach over head, and hygiene/grooming  PARTICIPATION LIMITATIONS: community activity, occupation, school, and   difficulty with sleeping, ADLS (brushing  teeth, shaving), lifting things like a couch, throwing a foot ball (overhand throwing), anything that requires using the right UE a lot, especially overhead  PERSONAL FACTORS: Past/current experiences and Time since onset of injury/illness/exacerbation are also affecting patient's functional outcome.   REHAB POTENTIAL: Good  CLINICAL DECISION MAKING: Stable/uncomplicated  EVALUATION COMPLEXITY: Low   GOALS: Goals reviewed with patient? No  SHORT TERM GOALS: Target date: 02/03/2023  Patient will be independent with initial home exercise program for self-management of symptoms. Baseline: Initial HEP provided at IE (01/20/23); Goal status: In-progress   LONG TERM GOALS: Target date: 04/14/2023  Patient will be independent with a long-term home exercise program for self-management of symptoms.  Baseline: Initial HEP provided at IE (01/20/23); Goal status: In-progress  2.  Patient will demonstrate improved FOTO to equal or greater than 80 by visit #10 to demonstrate improvement in overall condition and self-reported functional ability.  Baseline: 62 (01/20/23); 61 at visit #5 (02/09/2023);  Goal status: In-progress  3.  Patient will demonstrate R shoulder strength equal or greater to L shoulder strength with no increase in pain to improve his ability to use R UE for daily tasks such as overhead work, throwing a football, and grooming tasks with less difficulty.  Baseline: R ER 4+/5 and painful (01/20/23); Goal status: In-progress  4.  Patient will demonstrate ability to perform 20 consecutive overhead throws at the rebounder with 1kg med ball using R UE without increased pain to improve his ability to play sports, perform repetative overhead activities, and throw a football.  Baseline: reports throwing a football overhand is painful (01/20/23); Goal status: In-progress  5.  Patient will report being able to sleep on his right side without being awoken by right shoulder pain to  improve his ability to sleep comfortably.  Baseline: awoken by pain whenever he rolls on the right side (01/20/23); Goal status: In-progress   PLAN:  PT FREQUENCY: 1-2x/week  PT DURATION: 12 weeks  PLANNED INTERVENTIONS: Therapeutic exercises, Therapeutic activity, Neuromuscular re-education, Patient/Family education, Self Care, Joint mobilization, Dry Needling, Electrical stimulation, Spinal mobilization, Cryotherapy, Moist heat, Manual therapy, and Re-evaluation  PLAN FOR NEXT SESSION:  update HEP as appropriate, progressive loading as tolerated to the shoulder girdle focusing on rotator cuff musculature, functional/UE strengthening/motor control exercises, manual therapy/dry needling as needed, education.   Goal: Increase scapular stability before rotator cuff strength.   Consider the following if appropriate - Progress exercises to 3 sets - Shoulder flexion and abd in plank position (abd with sliders) - Ts, Ys, Is - Seated shoulder ER with elbow propped on knee at approximately 85 degrees scaption, 3x15 each side(difficult, no painful) - Horiz abd (band pull apart) - OH stability - KB OH carry, + other ones - Rows - rollouts - Pull ups - Push ups with stability challenge      Matthew Saras, Student-PT, DPT  Luretha Murphy. Ilsa Iha, PT, DPT 03/05/23, 7:13 PM  [facility] [address] [place], [place] [address] P: [phone] I F: [phone]

## 2023-03-10 ENCOUNTER — Ambulatory Visit: Payer: BC Managed Care – PPO | Admitting: Physical Therapy

## 2023-03-10 ENCOUNTER — Encounter: Payer: Self-pay | Admitting: Physical Therapy

## 2023-03-10 DIAGNOSIS — M25511 Pain in right shoulder: Secondary | ICD-10-CM | POA: Diagnosis not present

## 2023-03-10 DIAGNOSIS — G8929 Other chronic pain: Secondary | ICD-10-CM

## 2023-03-10 NOTE — Therapy (Addendum)
OUTPATIENT PHYSICAL THERAPY TREATMENT   Patient Name: Carlos Lloyd MRN: [id number] DOB:August 18, 2000, 22 y.o., male Today's Date: 03/10/2023  END OF SESSION:  PT End of Session - 03/10/23 1400     Visit Number 9    Number of Visits 13    Date for PT Re-Evaluation 04/14/23    Authorization Type [employer] COMM PPO reporting period from 01/20/2023    PT Start Time 1305    PT Stop Time 1350    PT Time Calculation (min) 45 min    Activity Tolerance Patient tolerated treatment well    Behavior During Therapy [facility] for tasks assessed/performed             History reviewed. No pertinent past medical history. History reviewed. No pertinent surgical history. There are no problems to display for this patient.   PCP: Eden Lathe, MD  REFERRING PROVIDER: Noralee Stain, MD   REFERRING DIAG: rotator cuff syndrome of right shoulder  THERAPY DIAG:  Chronic right shoulder pain  Rationale for Evaluation and Treatment: Rehabilitation  ONSET DATE: approx 1 year prior to PT evaluation  PERTINENT HISTORY: Patient is a 22 y.o. male who presents to outpatient physical therapy with a referral for medical diagnosis rotator cuff syndrome of right shoulder. This patient's chief complaints consist of chronic right shoulder pain leading to the following functional deficits: difficulty with sleeping, ADLS (brushing teeth, shaving), lifting things like a couch, throwing a foot ball (overhand throwing), anything that requires using the right UE a lot, especially overhead. Relevant past medical history and comorbidities include none reported.  Patient denies hx of cancer, stroke, seizures, lung problems, heart problems, diabetes, unexplained weight loss, unexplained changes in bowel or bladder problems, unexplained stumbling or dropping things, osteoporosis, and spinal surgery  SUBJECTIVE:                                                                                                                                                                                       SUBJECTIVE STATEMENT:  Patient reports that pain is the same as last time. He is doing most exercises 3-4 times per week, but the face pulls and cable IR have been less frequent because he hasn't made it to the gym.  He reports the most painful movements have ben lifting behind his back and abduction near end range,  PAIN:  NPRS: 2/10 lateral right shoulder.   PATIENT GOALS: "to be able to sleep on that side again" "be able to do simple tasks without pain" " and "be able to reach up in the air with my arm for a while without pain"  NEXT MD VISIT:    OBJECTIVE  TODAY'S TREATMENT:   Therapeutic exercise: to centralize symptoms and improve ROM, strength, muscular endurance, and activity tolerance required for successful completion of functional activities.  - Upper body ergometer level 9 to encourage joint nutrition, warm tissue, induce analgesic effect of aerobic exercise, improve muscular strength and endurance,  and prepare for remainder of session. 5 min total, changing directions ever 1 min.   2 rounds - superset Scap Clocks round 1 with blue band x10, round 2 with black band x8   Staggered push up + with cues to drive through the ground for serratus round 1 traditional, round 2 with T rotation (compensation and poor serratus engagement with rotation. Will modify next time)          2 rounds superset Face pulls x10 with 10 lbs, x10 with 10 lbs and OH press   Shoulder IR lift-off holds (behind back) 3x15 seconds with contract-relax       KB bottoms up overhead carry Held due to poor scap control overhead       Alternating chest press + x10 each with 10 lbs dumbbell (just 1 round)      2 rounds superset Prone swimmers X5   Dumbell Ts, Ys, Is x10 each, x6 each with 2 lbs dumbbell     PATIENT EDUCATION: Education details: Exercise purpose/form. Self management techniques.  Education on HEP including handout. Person  educated: Patient Education method: Explanation, Demonstration, and Handouts Education comprehension: verbalized understanding, returned demonstration, and needs further education  HOME EXERCISE PROGRAM: Access Code: [email or web address] URL: [email or web address] Date: 03/10/2023 Prepared by: Norton Blizzard  Exercises - Push Up with Plus  - 3-4 x weekly - 2 sets - 15 reps - 2 hold - Face Pulls  - 2 sets - 15 reps - 15 lbs weight - Supine Chest Press with Dumbbells  - 3-4 x weekly - 2 sets - 10 reps - Prone Angels  - 3 x weekly - 4 sets - 5 reps - Prone Shoulder Horizontal Abduction with Thumbs Up  - 3 x weekly - 2 sets - 6 reps - Prone Lower Trapezius Strengthening on Swiss Ball with Dumbbells  - 3 x weekly - 6 reps - Prone Single Arm Shoulder Extension  - 3 x weekly - 6 reps  ASSESSMENT:  CLINICAL IMPRESSION:   Patient arrives with pain similar to last week.  Scapular clocks were progressed from a blue band to a black band, but most exercise were kept consistent due to poor technique with increased loading.  He mentioned today that a lot of his pain was with behind the back movements, so prone lift-offs were added to his program to increase his strength and tolerance in that position.  Patient lacked scapular control and end-range internal rotation compared to his contralateral side, so a contract relax stretch was added in that position, followed by isometric holds at end range.  Patient had pain around his traps along with scapular winging, so Ts, Ys, Is, and prone swimmers were added to increase his scapular strength and stability.   Patient would benefit from continued management of limiting condition by skilled physical therapist to address remaining impairments and functional limitations to work towards stated goals and return to PLOF or maximal functional independence.  From Initial PT evaluation 01/20/2023: Patient is a 22 y.o. male referred to outpatient physical therapy with a  medical diagnosis of rotator cuff syndrome of right shoulder who presents with signs and symptoms most consistent with chronic right shoulder pain, most likely rotator  cuff related with less suspicion for labra tear. Patient demonstrates painful arc, pain/weakness with R shoulder resisted ER, positive hawkins-kennedy and popping/catching is not a chief compliant. He does appear to have slightly more laxity in the right GHJ compared to the left and pain in the posterior shoulder with anterior glide of GHJ while in maximum ER may suggest internal impingement. Positive O'brien's test is not corroborated well by other tests or symptoms. Patient would most likely benefit from stabilization and strengthening program for the right shoulder girdle with interventions for pain control and improved muscle function a needed. Patient presents with significant pain, ROM, joint mobility, muscle performance (strength/power/endurance), motor control, and activity tolerance impairments that are limiting ability to complete usual activities such a sleeping, ADLS (brushing teeth, shaving), lifting things like a couch, throwing a foot ball (overhand throwing), anything that requires using the right UE a lot, especially overhead without difficulty. Patient will benefit from skilled physical therapy intervention to address current body structure impairments and activity limitations to improve function and work towards goals set in current POC in order to return to prior level of function or maximal functional improvement.   OBJECTIVE IMPAIRMENTS: decreased activity tolerance, decreased endurance, decreased knowledge of condition, decreased ROM, decreased strength, impaired perceived functional ability, increased muscle spasms, impaired UE functional use, and pain.   ACTIVITY LIMITATIONS: carrying, lifting, sleeping, reach over head, and hygiene/grooming  PARTICIPATION LIMITATIONS: community activity, occupation, school, and    difficulty with sleeping, ADLS (brushing teeth, shaving), lifting things like a couch, throwing a foot ball (overhand throwing), anything that requires using the right UE a lot, especially overhead  PERSONAL FACTORS: Past/current experiences and Time since onset of injury/illness/exacerbation are also affecting patient's functional outcome.   REHAB POTENTIAL: Good  CLINICAL DECISION MAKING: Stable/uncomplicated  EVALUATION COMPLEXITY: Low   GOALS: Goals reviewed with patient? No  SHORT TERM GOALS: Target date: 02/03/2023  Patient will be independent with initial home exercise program for self-management of symptoms. Baseline: Initial HEP provided at IE (01/20/23); Goal status: In-progress   LONG TERM GOALS: Target date: 04/14/2023  Patient will be independent with a long-term home exercise program for self-management of symptoms.  Baseline: Initial HEP provided at IE (01/20/23); Goal status: In-progress  2.  Patient will demonstrate improved FOTO to equal or greater than 80 by visit #10 to demonstrate improvement in overall condition and self-reported functional ability.  Baseline: 62 (01/20/23); 61 at visit #5 (02/09/2023);  Goal status: In-progress  3.  Patient will demonstrate R shoulder strength equal or greater to L shoulder strength with no increase in pain to improve his ability to use R UE for daily tasks such as overhead work, throwing a football, and grooming tasks with less difficulty.  Baseline: R ER 4+/5 and painful (01/20/23); Goal status: In-progress  4.  Patient will demonstrate ability to perform 20 consecutive overhead throws at the rebounder with 1kg med ball using R UE without increased pain to improve his ability to play sports, perform repetative overhead activities, and throw a football.  Baseline: reports throwing a football overhand is painful (01/20/23); Goal status: In-progress  5.  Patient will report being able to sleep on his right side without  being awoken by right shoulder pain to improve his ability to sleep comfortably.  Baseline: awoken by pain whenever he rolls on the right side (01/20/23); Goal status: In-progress   PLAN:  PT FREQUENCY: 1-2x/week  PT DURATION: 12 weeks  PLANNED INTERVENTIONS: Therapeutic exercises, Therapeutic activity, Neuromuscular re-education, Patient/Family  education, Self Care, Joint mobilization, Dry Needling, Electrical stimulation, Spinal mobilization, Cryotherapy, Moist heat, Manual therapy, and Re-evaluation  PLAN FOR NEXT SESSION: update HEP as appropriate, progressive loading as tolerated to the shoulder girdle focusing on rotator cuff musculature, functional/UE strengthening/motor control exercises, manual therapy/dry needling as needed, education.   Goal: Increase scapular stability before rotator cuff strength.   Consider the following if appropriate - Progress exercises to 3 sets - Shoulder flexion and abd in plank position (abd with sliders) - Ts, Ys, Is - Seated shoulder ER with elbow propped on knee at approximately 85 degrees scaption, 3x15 each side(difficult, no painful) - Horiz abd (band pull apart) - OH stability - KB OH carry, + other ones - Rows - rollouts - Pull ups - Push ups with stability challenge      Matthew Saras, Student-PT, DPT  Luretha Murphy. Ilsa Iha, PT, DPT 03/10/23, 2:40 PM  [facility] [address] [place], [place] [address] P: [phone] I F: [phone]

## 2023-03-12 ENCOUNTER — Encounter: Payer: BLUE CROSS/BLUE SHIELD | Admitting: Physical Therapy

## 2023-03-17 ENCOUNTER — Encounter: Payer: Self-pay | Admitting: Physical Therapy

## 2023-03-17 ENCOUNTER — Ambulatory Visit: Payer: BC Managed Care – PPO | Admitting: Physical Therapy

## 2023-03-17 DIAGNOSIS — G8929 Other chronic pain: Secondary | ICD-10-CM

## 2023-03-17 DIAGNOSIS — M25511 Pain in right shoulder: Secondary | ICD-10-CM | POA: Diagnosis not present

## 2023-03-17 NOTE — Therapy (Cosign Needed Addendum)
OUTPATIENT PHYSICAL THERAPY TREATMENT / PROGRESS NOTE Dates of reporting from 01/20/2023 to 03/17/2023   Patient Name: Carlos Lloyd MRN: [id number] DOB:[birth year], 22 y.o., male Today's Date: 03/17/2023  END OF SESSION:  PT End of Session - 03/17/23 1500     Visit Number 10    Number of Visits 13    Date for PT Re-Evaluation 04/14/23    Authorization Type [employer]BS COMM PPO reporting period from 01/20/2023    PT Start Time 1305    PT Stop Time 1348    PT Time Calculation (min) 43 min    Activity Tolerance Patient tolerated treatment well    Behavior During Therapy [facility] for tasks assessed/performed              History reviewed. No pertinent past medical history. History reviewed. No pertinent surgical history. There are no problems to display for this patient.   PCP: Eden Lathe, MD  REFERRING PROVIDER: Noralee Stain, MD   REFERRING DIAG: rotator cuff syndrome of right shoulder  THERAPY DIAG:  Chronic right shoulder pain  Rationale for Evaluation and Treatment: Rehabilitation  ONSET DATE: approx 1 year prior to PT evaluation  PERTINENT HISTORY: Patient is a 22 y.o. male who presents to outpatient physical therapy with a referral for medical diagnosis rotator cuff syndrome of right shoulder. This patient's chief complaints consist of chronic right shoulder pain leading to the following functional deficits: difficulty with sleeping, ADLS (brushing teeth, shaving), lifting things like a couch, throwing a foot ball (overhand throwing), anything that requires using the right UE a lot, especially overhead. Relevant past medical history and comorbidities include none reported.  Patient denies hx of cancer, stroke, seizures, lung problems, heart problems, diabetes, unexplained weight loss, unexplained changes in bowel or bladder problems, unexplained stumbling or dropping things, osteoporosis, and spinal surgery  SUBJECTIVE:                                                                                                                                                                                       SUBJECTIVE STATEMENT:  Patient reports that pain is the same as last time today, but that he slept on it weird last night which is why it was hurting. He reports less pain when going behind his back, as compared to last time.  He has been going to the [employer] regularly and doing his HEP.  PAIN:  NPRS: 2/10 lateral right shoulder.   PATIENT GOALS: "to be able to sleep on that side again" "be able to do simple tasks without pain" " and "be able to reach up in the air with my arm for a while without  pain"  NEXT MD VISIT:    OBJECTIVE    TODAY'S TREATMENT:   Therapeutic exercise: to centralize symptoms and improve ROM, strength, muscular endurance, and activity tolerance required for successful completion of functional activities.    Scap Clocks With slider in plank position; blue band on wrist - x10 each; 2x5 each in quadruped, x10 each in quadruped therex        Posterior capsule mobilization Glenohumeral post. capsule mob: Grade III 2x30 seconds in supine (shoulder flexion was assessed before and after without much change) manual       3 rounds superset Face pulls Prone swimmers x10 therex   Cable IR Lift off holds: 3x15 seconds with contract-relax (with therapist assist on sets 1 and 2) therex       3 rounds superset Push up to color tap 10 push ups with right arm blaze-pod tap, 10 push ups with left arm light tap (30 secs rest in between. Rounds 2 and 3: 2 blaze-pod taps between push ups therex   Dumbbell Ts, Ys, Is x10 each, 2x10 each with 2 lbs weight Prone on aerobic step with towel on it: Dumbbell Ts, Ys, Is x10 each, 2x10 each with 2 lbs weight therex     PATIENT EDUCATION: Education details: Exercise purpose/form. Self management techniques.  Education on HEP including handout. Person educated: Patient Education method: Explanation, Demonstration,  and Handouts Education comprehension: verbalized understanding, returned demonstration, and needs further education  HOME EXERCISE PROGRAM: Access Code: [email or web address] URL: [email or web address] Date: 03/10/2023 Prepared by: Norton Blizzard  Exercises - Push Up with Plus  - 3-4 x weekly - 3 sets - 15 reps - 2 hold - Face Pulls  - 3 sets - 15 reps - 15 lbs weight - Supine Chest Press with Dumbbells  - 3-4 x weekly - 3 sets - 10 reps - Prone Angels  - 3 x weekly - 4 sets - 5 reps - Prone Shoulder Horizontal Abduction with Thumbs Up  - 3 x weekly - 3 sets - 6 reps - Prone Lower Trapezius Strengthening on Swiss Ball with Dumbbells  - 3 x weekly - 6 reps - Prone Single Arm Shoulder Extension  - 3 x weekly - 6 reps  ASSESSMENT:  CLINICAL IMPRESSION:   Patient has attended 10 physical therapy sessions since starting current episode of care on 01/20/2023. Patient has generally been able to tolerate increasing volume and and load to the left shoulder and appears to be making progress overall but has not yet returned to prior level of function. He would benefit from continued PT to continue addressing posterior shoulder tightness and remaining pain.  Today, patient arrives with similar pain to last week, but an easier time reaching behind his back.  This session continued to progress periscapular strengthening exercises initiated last week.  Wall clocks were progressed to push-up position, but needed to be regressed to quadruped due to poor serratus anterior engagement.  Volume was increased in the other exercises to increase endurance, progressing from 2 sets to 3 sets of exercises.  Patient would benefit from continued management of limiting condition by skilled physical therapist to address remaining impairments and functional limitations to work towards stated goals and return to PLOF or maximal functional independence.  From Initial PT evaluation 01/20/2023: Patient is a 22 y.o. male referred to  outpatient physical therapy with a medical diagnosis of rotator cuff syndrome of right shoulder who presents with signs and symptoms most consistent with chronic right shoulder pain, most likely  rotator cuff related with less suspicion for labra tear. Patient demonstrates painful arc, pain/weakness with R shoulder resisted ER, positive hawkins-kennedy and popping/catching is not a chief compliant. He does appear to have slightly more laxity in the right GHJ compared to the left and pain in the posterior shoulder with anterior glide of GHJ while in maximum ER may suggest internal impingement. Positive O'brien's test is not corroborated well by other tests or symptoms. Patient would most likely benefit from stabilization and strengthening program for the right shoulder girdle with interventions for pain control and improved muscle function a needed. Patient presents with significant pain, ROM, joint mobility, muscle performance (strength/power/endurance), motor control, and activity tolerance impairments that are limiting ability to complete usual activities such a sleeping, ADLS (brushing teeth, shaving), lifting things like a couch, throwing a foot ball (overhand throwing), anything that requires using the right UE a lot, especially overhead without difficulty. Patient will benefit from skilled physical therapy intervention to address current body structure impairments and activity limitations to improve function and work towards goals set in current POC in order to return to prior level of function or maximal functional improvement.   OBJECTIVE IMPAIRMENTS: decreased activity tolerance, decreased endurance, decreased knowledge of condition, decreased ROM, decreased strength, impaired perceived functional ability, increased muscle spasms, impaired UE functional use, and pain.   ACTIVITY LIMITATIONS: carrying, lifting, sleeping, reach over head, and hygiene/grooming  PARTICIPATION LIMITATIONS: community  activity, occupation, school, and   difficulty with sleeping, ADLS (brushing teeth, shaving), lifting things like a couch, throwing a foot ball (overhand throwing), anything that requires using the right UE a lot, especially overhead  PERSONAL FACTORS: Past/current experiences and Time since onset of injury/illness/exacerbation are also affecting patient's functional outcome.   REHAB POTENTIAL: Good  CLINICAL DECISION MAKING: Stable/uncomplicated  EVALUATION COMPLEXITY: Low   GOALS: Goals reviewed with patient? No  SHORT TERM GOALS: Target date: 02/03/2023  Patient will be independent with initial home exercise program for self-management of symptoms. Baseline: Initial HEP provided at IE (01/20/23); Goal status: In-progress   LONG TERM GOALS: Target date: 04/14/2023  Patient will be independent with a long-term home exercise program for self-management of symptoms.  Baseline: Initial HEP provided at IE (01/20/23); participating well (03/17/2023);  Goal status: In-progress  2.  Patient will demonstrate improved FOTO to equal or greater than 80 by visit #10 to demonstrate improvement in overall condition and self-reported functional ability.  Baseline: 62 (01/20/23); 61 at visit #5 (02/09/2023);  Goal status: In-progress  3.  Patient will demonstrate R shoulder strength equal or greater to L shoulder strength with no increase in pain to improve his ability to use R UE for daily tasks such as overhead work, throwing a football, and grooming tasks with less difficulty.  Baseline: R ER 4+/5 and painful (01/20/23);  Goal status: In-progress  4.  Patient will demonstrate ability to perform 20 consecutive overhead throws at the rebounder with 1kg med ball using R UE without increased pain to improve his ability to play sports, perform repetative overhead activities, and throw a football.  Baseline: reports throwing a football overhand is painful (01/20/23); Goal status: In-progress  5.   Patient will report being able to sleep on his right side without being awoken by right shoulder pain to improve his ability to sleep comfortably.  Baseline: awoken by pain whenever he rolls on the right side (01/20/23); Goal status: In-progress   PLAN:  PT FREQUENCY: 1-2x/week  PT DURATION: 12 weeks  PLANNED INTERVENTIONS: Therapeutic  exercises, Therapeutic activity, Neuromuscular re-education, Patient/Family education, Self Care, Joint mobilization, Dry Needling, Electrical stimulation, Spinal mobilization, Cryotherapy, Moist heat, Manual therapy, and Re-evaluation  PLAN FOR NEXT SESSION: update HEP as appropriate, progressive loading as tolerated to the shoulder girdle focusing on rotator cuff musculature, functional/UE strengthening/motor control exercises, manual therapy/dry needling as needed, education.    Matthew Saras, Student-PT, DPT  Luretha Murphy Ilsa Iha, PT, DPT 03/17/23, 4:34 PM  [facility] [address] [place], [place] [address] P: [phone] I F: [phone]

## 2023-03-19 ENCOUNTER — Encounter: Payer: BLUE CROSS/BLUE SHIELD | Admitting: Physical Therapy

## 2023-03-24 ENCOUNTER — Ambulatory Visit: Payer: BC Managed Care – PPO | Admitting: Physical Therapy

## 2023-03-31 ENCOUNTER — Encounter: Payer: BLUE CROSS/BLUE SHIELD | Admitting: Physical Therapy

## 2023-04-02 ENCOUNTER — Encounter: Payer: Self-pay | Admitting: Physical Therapy

## 2023-04-02 ENCOUNTER — Ambulatory Visit: Payer: BC Managed Care – PPO | Attending: Family Medicine | Admitting: Physical Therapy

## 2023-04-02 DIAGNOSIS — M25511 Pain in right shoulder: Secondary | ICD-10-CM | POA: Diagnosis present

## 2023-04-02 DIAGNOSIS — G8929 Other chronic pain: Secondary | ICD-10-CM | POA: Insufficient documentation

## 2023-04-02 NOTE — Therapy (Addendum)
OUTPATIENT PHYSICAL THERAPY TREATMENT / PROGRESS NOTE / RE-CERTIFICATION Dates of reporting from 01/20/2023 to 04/02/2023   Patient Name: Carlos Lloyd MRN: [id number] DOB:10/11/2000, 22 y.o., male Today's Date: 04/02/2023  END OF SESSION:  PT End of Session - 04/02/23 1458     Visit Number 11    Number of Visits 13    Date for PT Re-Evaluation 06/25/23    Authorization Type [employer] COMM PPO reporting period from 01/20/2023    PT Start Time 1305    PT Stop Time 1350    PT Time Calculation (min) 45 min    Activity Tolerance Patient tolerated treatment well    Behavior During Therapy [facility] for tasks assessed/performed               History reviewed. No pertinent past medical history. History reviewed. No pertinent surgical history. There are no problems to display for this patient.   PCP: Eden Lathe, MD  REFERRING PROVIDER: Noralee Stain, MD   REFERRING DIAG: rotator cuff syndrome of right shoulder  THERAPY DIAG:  Chronic right shoulder pain  Rationale for Evaluation and Treatment: Rehabilitation  ONSET DATE: approx 1 year prior to PT evaluation  PERTINENT HISTORY: Patient is a 22 y.o. male who presents to outpatient physical therapy with a referral for medical diagnosis rotator cuff syndrome of right shoulder. This patient's chief complaints consist of chronic right shoulder pain leading to the following functional deficits: difficulty with sleeping, ADLS (brushing teeth, shaving), lifting things like a couch, throwing a foot ball (overhand throwing), anything that requires using the right UE a lot, especially overhead. Relevant past medical history and comorbidities include none reported.  Patient denies hx of cancer, stroke, seizures, lung problems, heart problems, diabetes, unexplained weight loss, unexplained changes in bowel or bladder problems, unexplained stumbling or dropping things, osteoporosis, and spinal surgery  SUBJECTIVE:                                                                                                                                                                                       SUBJECTIVE STATEMENT:  Patient reports slightly less pain than before and has been consistent with HEP and it feels slightly better bringing hands behind back.  Patient reports that PT has been helping and he is feeling less constant pain.  Reaching OH and behind back feels better, but it would probably start hurting if he did it a lot. Doesn't fully trust the arm yet, and he is not sleeping on it or throwing things yet.  PAIN:  NPRS: 1/10 lateral right shoulder.   PATIENT GOALS: "to be able to sleep on that side again" "be able  to do simple tasks without pain" " and "be able to reach up in the air with my arm for a while without pain"  NEXT MD VISIT:    OBJECTIVE  SELF-REPORTED FUNCTION FOTO score: 62/100 (Shoulder questionnaire)  AROM:  Flexion and Abduction ROM were WNL and grossly equivalent side to side, but patient reported pain at end range flexion and with OP, and pain at end range abduction with OP.   MUSCLE PERFORMANCE (MMT):   *Indicates pain 01/20/23 04/01/23 Date  Joint/Motion R/L R/L R/L  Shoulder        Flexion 5/5 / /  Abduction (C5) 5/5 / /  External rotation 4+*/5 5/5 (prone and seated) /  Internal rotation 5/5 / /  Extension / / /  Periscapular     Mid-Traps   4-/4+   Low Traps   4-/4-   Elbow        Flexion (C6) 5/5 / /  Extension (C7) 5/5 / /  Hand        Thumb extension (C8) B WNL / /  Finger abduction (T1) B WNL / /     TODAY'S TREATMENT:     Therex UBE 8 minutes 1/2 fwd 1/2 bckwd  Therex Prone swimmers 2x12 with 1 lb weight (pain 2/10)  Therex Push up to color tap 14 push ups, 28 hits, 29 (tapping right), 30 hits, 28 hits (tapping left)  Therex small yellow Body blade overhead hold 2x30 seconds each side  Therex Bottoms up kettlebell press x10 on R arm (HEP trial)  Therex ROM and MMT to assess  progress (see above)    Theract Overhead throw assessment (see above) x20 with 1 kg ball on rebounder   .Pt required multimodal cuing for proper technique and to facilitate improved neuromuscular control, strength, range of motion, and functional ability resulting in improved performance and form.    PATIENT EDUCATION: Education details: Exercise purpose/form. Self management techniques.  Education on HEP including handout. Person educated: Patient Education method: Explanation, Demonstration, and Handouts Education comprehension: verbalized understanding, returned demonstration, and needs further education  HOME EXERCISE PROGRAM: Access Code: [email or web address] URL: [email or web address] Date: 04/02/2023 Prepared by: Norton Blizzard  Exercises - Face Pulls  - 2 sets - 15 reps - 15 lbs weight - Supine Chest Press with Dumbbells  - 3-4 x weekly - 2 sets - 10 reps - Prone Angels  - 3 x weekly - 2 sets - 12 reps - Prone Shoulder Horizontal Abduction with Thumbs Up  - 3 x weekly - 2 sets - 10 reps - Prone Lower Trapezius Strengthening on Swiss Ball with Dumbbells  - 3 x weekly - 10 reps - Primal Push Up with Shoulder Taps  - 30 seconds hold - Half-Kneeling Bottoms Up ONEOK  - 3 sets - 8-12 reps  ASSESSMENT:  CLINICAL IMPRESSION:   Patient has attended 11 physical therapy sessions since starting current episode of care on 01/20/2023. Patient has generally been able to tolerate increasing volume and and load to the left shoulder and appears to be making progress overall but has not yet returned to prior level of function.  His pain is slowly decreasing and he reports that it was down to a 1/10 this week.  Despite no change in patient's FOTO score, he reports that PT has been helping him and that his function has increased.  Patient on track with his HEP goal, as he is doing the exercises consistently. He has reached his OH throw goal  at the rebounder but continues to  express difficulty throwing with his right shoulder and generally "trusting it" with heavier weights.  His shoulder ER strength has improved to 5/5 bilaterally in seated and in prone, but his periscapular muscles still need continued strengthening.  Patient would benefit from exercises to increase his OH and end-range strength and stability. Patient would benefit from continued management of limiting condition by skilled physical therapist to address remaining impairments and functional limitations to work towards stated goals and return to PLOF or maximal functional independence.   From Initial PT evaluation 01/20/2023: Patient is a 22 y.o. male referred to outpatient physical therapy with a medical diagnosis of rotator cuff syndrome of right shoulder who presents with signs and symptoms most consistent with chronic right shoulder pain, most likely rotator cuff related with less suspicion for labra tear. Patient demonstrates painful arc, pain/weakness with R shoulder resisted ER, positive hawkins-kennedy and popping/catching is not a chief compliant. He does appear to have slightly more laxity in the right GHJ compared to the left and pain in the posterior shoulder with anterior glide of GHJ while in maximum ER may suggest internal impingement. Positive O'brien's test is not corroborated well by other tests or symptoms. Patient would most likely benefit from stabilization and strengthening program for the right shoulder girdle with interventions for pain control and improved muscle function a needed. Patient presents with significant pain, ROM, joint mobility, muscle performance (strength/power/endurance), motor control, and activity tolerance impairments that are limiting ability to complete usual activities such a sleeping, ADLS (brushing teeth, shaving), lifting things like a couch, throwing a foot ball (overhand throwing), anything that requires using the right UE a lot, especially overhead without  difficulty. Patient will benefit from skilled physical therapy intervention to address current body structure impairments and activity limitations to improve function and work towards goals set in current POC in order to return to prior level of function or maximal functional improvement.   OBJECTIVE IMPAIRMENTS: decreased activity tolerance, decreased endurance, decreased knowledge of condition, decreased ROM, decreased strength, impaired perceived functional ability, increased muscle spasms, impaired UE functional use, and pain.   ACTIVITY LIMITATIONS: carrying, lifting, sleeping, reach over head, and hygiene/grooming  PARTICIPATION LIMITATIONS: community activity, occupation, school, and   difficulty with sleeping, ADLS (brushing teeth, shaving), lifting things like a couch, throwing a foot ball (overhand throwing), anything that requires using the right UE a lot, especially overhead  PERSONAL FACTORS: Past/current experiences and Time since onset of injury/illness/exacerbation are also affecting patient's functional outcome.   REHAB POTENTIAL: Good  CLINICAL DECISION MAKING: Stable/uncomplicated  EVALUATION COMPLEXITY: Low   GOALS: Goals reviewed with patient? No  SHORT TERM GOALS: Target date: 02/03/2023  Patient will be independent with initial home exercise program for self-management of symptoms. Baseline: Initial HEP provided at IE (01/20/23); Goal status: In-progress   LONG TERM GOALS: Target date: 04/14/2023. Target date updated to 06/24/2022 for all unmet/new goals on 04/02/2023.   Patient will be independent with a long-term home exercise program for self-management of symptoms.  Baseline: Initial HEP provided at IE (01/20/23); participating well (03/17/2023); participating well (04/02/2023) Goal status: In-progress  2.  Patient will demonstrate improved FOTO to equal or greater than 80 by visit #10 to demonstrate improvement in overall condition and self-reported functional  ability.  Baseline: 62 (01/20/23); 61 at visit #5 (02/09/2023); 62 at visit #11 (04/02/2023) Goal status: In-progress  3.  Patient will demonstrate R shoulder strength equal or greater to L shoulder strength with  no increase in pain to improve his ability to use R UE for daily tasks such as overhead work, throwing a football, and grooming tasks with less difficulty.  Baseline: R ER 4+/5 and painful (01/20/23); 5/5 B ER (04/02/23) Goal status: MET  4.  Patient will demonstrate ability to perform 20 consecutive overhead throws at the rebounder with 1kg med ball using R UE without increased pain to improve his ability to play sports, perform repetative overhead activities, and throw a football.  Baseline: reports throwing a football overhand is painful (01/20/23); Did the 20 throws with no pain, but "maybe slight soreness" (04/02/2023) Goal status: MET  5.  Patient will report being able to sleep on his right side without being awoken by right shoulder pain to improve his ability to sleep comfortably.  Baseline: awoken by pain whenever he rolls on the right side (01/20/23); still cannot sleep on that side (04/02/2023) Goal status: NEW  6. Patient will do 10 bottoms-up 20# kettlebell OH presses with his right arm to improve his overhead shoulder stability and allow him to "trust his shoulder" when lifting heavy objects and putting them on shelves  Baseline: He did 5 in session with 10# (04/02/2023)  Goal status: NEW  7. Patient will demonstrate ability to perform 1 minute of consecutive overhead throws at the rebounder with 3kg med ball using R UE without increased pain to improve his ability to play sports, perform repetative overhead activities, and throw a football.   Baseline: He did 20 consecutive throws with 1kg medball and no pain  Goal status: NEW    PLAN:  PT FREQUENCY: 1-2x/week  PT DURATION: 12 weeks  PLANNED INTERVENTIONS: Therapeutic exercises, Therapeutic activity, Neuromuscular  re-education, Patient/Family education, Self Care, Joint mobilization, Dry Needling, Electrical stimulation, Spinal mobilization, Cryotherapy, Moist heat, Manual therapy, and Re-evaluation  PLAN FOR NEXT SESSION: update HEP as appropriate, progressive loading as tolerated to the shoulder girdle focusing on rotator cuff musculature, functional/UE strengthening/motor control exercises, manual therapy/dry needling as needed, education.    Matthew Saras, Student-PT, DPT  Luretha Murphy Ilsa Iha, PT, DPT 04/02/23, 11:28 PM  [facility] [address] [place], [place] [address] P: [phone] I F: [phone]

## 2023-04-07 ENCOUNTER — Encounter: Payer: Self-pay | Admitting: Physical Therapy

## 2023-04-07 ENCOUNTER — Ambulatory Visit: Payer: BC Managed Care – PPO | Admitting: Physical Therapy

## 2023-04-07 DIAGNOSIS — G8929 Other chronic pain: Secondary | ICD-10-CM

## 2023-04-07 DIAGNOSIS — M25511 Pain in right shoulder: Secondary | ICD-10-CM | POA: Diagnosis not present

## 2023-04-07 NOTE — Therapy (Addendum)
OUTPATIENT PHYSICAL THERAPY TREATMENT  Patient Name: Carlos Lloyd MRN: [id number] DOB:[birth year], 22 y.o., male Today's Date: 04/07/2023  END OF SESSION:  PT End of Session - 04/07/23 2023     Visit Number 12    Number of Visits 13    Date for PT Re-Evaluation 06/25/23    Authorization Type [employer] COMM PPO reporting period from 03/17/2023    PT Start Time 1305    PT Stop Time 1350    PT Time Calculation (min) 45 min    Activity Tolerance Patient tolerated treatment well    Behavior During Therapy [facility] for tasks assessed/performed                History reviewed. No pertinent past medical history. History reviewed. No pertinent surgical history. There are no problems to display for this patient.   PCP: Eden Lathe, MD  REFERRING PROVIDER: Noralee Stain, MD   REFERRING DIAG: rotator cuff syndrome of right shoulder  THERAPY DIAG:  Chronic right shoulder pain  Rationale for Evaluation and Treatment: Rehabilitation  ONSET DATE: approx 1 year prior to PT evaluation  PERTINENT HISTORY: Patient is a 22 y.o. male who presents to outpatient physical therapy with a referral for medical diagnosis rotator cuff syndrome of right shoulder. This patient's chief complaints consist of chronic right shoulder pain leading to the following functional deficits: difficulty with sleeping, ADLS (brushing teeth, shaving), lifting things like a couch, throwing a foot ball (overhand throwing), anything that requires using the right UE a lot, especially overhead. Relevant past medical history and comorbidities include none reported.  Patient denies hx of cancer, stroke, seizures, lung problems, heart problems, diabetes, unexplained weight loss, unexplained changes in bowel or bladder problems, unexplained stumbling or dropping things, osteoporosis, and spinal surgery  SUBJECTIVE:                                                                                                                                                                                       SUBJECTIVE STATEMENT:  Patient reports slightly increased pain today after sleeping on his back on Saturday and Sunday. It also hurts when leaning on it sometimes as well.   PAIN:  NPRS: 2/10 lateral right shoulder.   PATIENT GOALS: "to be able to sleep on that side again" "be able to do simple tasks without pain" " and "be able to reach up in the air with my arm for a while without pain"  NEXT MD VISIT:    OBJECTIVE  Observed patient positioning himself in painful position lying on shoulder (lying on his right bicep)  Palpation of painful spot on right rear-deltoid.   TODAY'S TREATMENT:  Observed patient positioning himself in painful position lying on shoulder (lying on his right bicep)  Manual therapy: to reduce pain and tissue tension, improve range of motion, neuromodulation, in order to promote improved ability to complete functional activities.   Palpation of painful spot on right rear-deltoid.  STM: x10 horizontal ABD with trigger point release  R. shoulder Supine A-P glide  3x30 seconds grade 3-4   Therapeutic exercise: to centralize symptoms and improve ROM, strength, muscular endurance, and activity tolerance required for successful completion of functional activities.   Therex Warm-up Nustep level 6: 6 minutes  Therex Prone swimmers 3x15 with 1lb weight  Therex Push up to blazepod tap 1 minute: 12 push ups per side (24 push ups) - 72 hits (1 push up, then 3 taps)  Therex Body blade overhead hold 2x40 seconds per side with small one, 1x40 seconds per side with large one  Therex Bottoms up kettlebell press x20 on right arm, x15 on right arm  Therex Renegade rows push up position x15/side with 6 lbs, x8/side with 15 lbs (cues for scap protraction)  Therex Behind head PVC shoulder press x10 in prone (did x10 standing but patient found that too easy so progressed to prone. Pt found it challenging but not  painful)    .Pt required multimodal cuing for proper technique and to facilitate improved neuromuscular control, strength, range of motion, and functional ability resulting in improved performance and form.    PATIENT EDUCATION: Education details: Exercise purpose/form. Self management techniques.  Education on HEP including handout. Person educated: Patient Education method: Explanation, Demonstration, and Handouts Education comprehension: verbalized understanding, returned demonstration, and needs further education  HOME EXERCISE PROGRAM: Access Code: [email or web address] URL: [email or web address] Date: 04/02/2023 Prepared by: Norton Blizzard  Exercises - Face Pulls  - 2 sets - 15 reps - 15 lbs weight - Supine Chest Press with Dumbbells  - 3-4 x weekly - 2 sets - 10 reps - Prone Angels  - 3 x weekly - 2 sets - 12 reps - Prone Shoulder Horizontal Abduction with Thumbs Up  - 3 x weekly - 2 sets - 10 reps - Prone Lower Trapezius Strengthening on Swiss Ball with Dumbbells  - 3 x weekly - 10 reps - Primal Push Up with Shoulder Taps  - 30 seconds hold - Half-Kneeling Bottoms Up ONEOK  - 3 sets - 8-12 reps  ASSESSMENT:  CLINICAL IMPRESSION:   Patient came in with slightly increased pain today (2/10) after waking up on his back on Saturday and Sunday, as well as possibly having fallen asleep on his right shoulder. Today was spent trying to identify any trigger points or specific positions that are causing pain and can be treated, as well as increasing shoulder stability demands overhead.  A trigger point was found on his right posterior deltoid and patient noted it felt "looser" after the trigger point release, but there was no change in pain.  Patient noted increased pain when leaning on right bicep while sidelying, and prone behind the head dowel pullovers seemed to replicate the painful position, which was weak but not painful.  Patient responded well to strengthening  in that movement pattern and would benefit from continued strength and motor control at end range shoulder elevation. Patient would benefit from continued management of limiting condition by skilled physical therapist to address remaining impairments and functional limitations to work towards stated goals and return to PLOF or maximal functional independence.    From Initial PT evaluation 01/20/2023:  Patient is a 22 y.o. male referred to outpatient physical therapy with a medical diagnosis of rotator cuff syndrome of right shoulder who presents with signs and symptoms most consistent with chronic right shoulder pain, most likely rotator cuff related with less suspicion for labra tear. Patient demonstrates painful arc, pain/weakness with R shoulder resisted ER, positive hawkins-kennedy and popping/catching is not a chief compliant. He does appear to have slightly more laxity in the right GHJ compared to the left and pain in the posterior shoulder with anterior glide of GHJ while in maximum ER may suggest internal impingement. Positive O'brien's test is not corroborated well by other tests or symptoms. Patient would most likely benefit from stabilization and strengthening program for the right shoulder girdle with interventions for pain control and improved muscle function a needed. Patient presents with significant pain, ROM, joint mobility, muscle performance (strength/power/endurance), motor control, and activity tolerance impairments that are limiting ability to complete usual activities such a sleeping, ADLS (brushing teeth, shaving), lifting things like a couch, throwing a foot ball (overhand throwing), anything that requires using the right UE a lot, especially overhead without difficulty. Patient will benefit from skilled physical therapy intervention to address current body structure impairments and activity limitations to improve function and work towards goals set in current POC in order to return to  prior level of function or maximal functional improvement.   OBJECTIVE IMPAIRMENTS: decreased activity tolerance, decreased endurance, decreased knowledge of condition, decreased ROM, decreased strength, impaired perceived functional ability, increased muscle spasms, impaired UE functional use, and pain.   ACTIVITY LIMITATIONS: carrying, lifting, sleeping, reach over head, and hygiene/grooming  PARTICIPATION LIMITATIONS: community activity, occupation, school, and   difficulty with sleeping, ADLS (brushing teeth, shaving), lifting things like a couch, throwing a foot ball (overhand throwing), anything that requires using the right UE a lot, especially overhead  PERSONAL FACTORS: Past/current experiences and Time since onset of injury/illness/exacerbation are also affecting patient's functional outcome.   REHAB POTENTIAL: Good  CLINICAL DECISION MAKING: Stable/uncomplicated  EVALUATION COMPLEXITY: Low   GOALS: Goals reviewed with patient? No  SHORT TERM GOALS: Target date: 02/03/2023  Patient will be independent with initial home exercise program for self-management of symptoms. Baseline: Initial HEP provided at IE (01/20/23); Goal status: In-progress   LONG TERM GOALS: Target date: 04/14/2023. Target date updated to 06/24/2022 for all unmet/new goals on 04/02/2023.   Patient will be independent with a long-term home exercise program for self-management of symptoms.  Baseline: Initial HEP provided at IE (01/20/23); participating well (03/17/2023); participating well (04/02/2023) Goal status: In-progress  2.  Patient will demonstrate improved FOTO to equal or greater than 80 by visit #10 to demonstrate improvement in overall condition and self-reported functional ability.  Baseline: 62 (01/20/23); 61 at visit #5 (02/09/2023); 62 at visit #11 (04/02/2023) Goal status: In-progress  3.  Patient will demonstrate R shoulder strength equal or greater to L shoulder strength with no increase in  pain to improve his ability to use R UE for daily tasks such as overhead work, throwing a football, and grooming tasks with less difficulty.  Baseline: R ER 4+/5 and painful (01/20/23); 5/5 B ER (04/02/23) Goal status: MET  4.  Patient will demonstrate ability to perform 20 consecutive overhead throws at the rebounder with 1kg med ball using R UE without increased pain to improve his ability to play sports, perform repetative overhead activities, and throw a football.  Baseline: reports throwing a football overhand is painful (01/20/23); Did the 20 throws with  no pain, but "maybe slight soreness" (04/02/2023) Goal status: MET  5.  Patient will report being able to sleep on his right side without being awoken by right shoulder pain to improve his ability to sleep comfortably.  Baseline: awoken by pain whenever he rolls on the right side (01/20/23); still cannot sleep on that side (04/02/2023) Goal status: In-progress  6. Patient will do 10 bottoms-up 20# kettlebell OH presses with his right arm to improve his overhead shoulder stability and allow him to "trust his shoulder" when lifting heavy objects and putting them on shelves  Baseline: He did 5 in session with 10# (04/02/2023)  Goal status: In-progress  7. Patient will demonstrate ability to perform 1 minute of consecutive overhead throws at the rebounder with 3kg med ball using R UE without increased pain to improve his ability to play sports, perform repetative overhead activities, and throw a football.   Baseline: He did 20 consecutive throws with 1kg medball and no pain  Goal status: In-progress    PLAN:  PT FREQUENCY: 1-2x/week  PT DURATION: 12 weeks  PLANNED INTERVENTIONS: Therapeutic exercises, Therapeutic activity, Neuromuscular re-education, Patient/Family education, Self Care, Joint mobilization, Dry Needling, Electrical stimulation, Spinal mobilization, Cryotherapy, Moist heat, Manual therapy, and Re-evaluation  PLAN FOR NEXT  SESSION: update HEP as appropriate, progressive loading as tolerated to the shoulder girdle focusing on rotator cuff musculature, functional/UE strengthening/motor control exercises, manual therapy/dry needling as needed, education.    Matthew Saras, Student-PT, DPT  Luretha Murphy Ilsa Iha, PT, DPT 04/07/23, 8:29 PM  [facility] [address] [place], [place] [address] P: [phone] I F: [phone]

## 2023-04-09 ENCOUNTER — Encounter: Payer: BLUE CROSS/BLUE SHIELD | Admitting: Physical Therapy

## 2023-05-11 ENCOUNTER — Encounter: Payer: Self-pay | Admitting: Physical Therapy

## 2023-05-13 ENCOUNTER — Ambulatory Visit: Payer: BC Managed Care – PPO | Attending: Family Medicine | Admitting: Physical Therapy

## 2023-05-13 DIAGNOSIS — G8929 Other chronic pain: Secondary | ICD-10-CM | POA: Insufficient documentation

## 2023-05-13 DIAGNOSIS — M25511 Pain in right shoulder: Secondary | ICD-10-CM | POA: Diagnosis present

## 2023-05-13 NOTE — Therapy (Signed)
OUTPATIENT PHYSICAL THERAPY TREATMENT  Patient Name: Carlos Lloyd MRN: [id number] DOB:15-Jun-2000, 23 y.o., male Today's Date: 05/14/2023  END OF SESSION:  PT End of Session - 05/14/23 2238     Visit Number 13    Number of Visits 13    Date for PT Re-Evaluation 06/25/23    Authorization Type [employer] COMM PPO reporting period from 03/17/2023    PT Start Time 1735    PT Stop Time 1815    PT Time Calculation (min) 40 min    Activity Tolerance Patient tolerated treatment well    Behavior During Therapy [facility] for tasks assessed/performed             History reviewed. No pertinent past medical history. History reviewed. No pertinent surgical history. There are no active problems to display for this patient.   PCP: Eden Lathe, MD  REFERRING PROVIDER: Noralee Stain, MD   REFERRING DIAG: rotator cuff syndrome of right shoulder  THERAPY DIAG:  Chronic right shoulder pain  Rationale for Evaluation and Treatment: Rehabilitation  ONSET DATE: approx 1 year prior to PT evaluation  PERTINENT HISTORY: Patient is a 23 y.o. male who presents to outpatient physical therapy with a referral for medical diagnosis rotator cuff syndrome of right shoulder. This patient's chief complaints consist of chronic right shoulder pain leading to the following functional deficits: difficulty with sleeping, ADLS (brushing teeth, shaving), lifting things like a couch, throwing a foot ball (overhand throwing), anything that requires using the right UE a lot, especially overhead. Relevant past medical history and comorbidities include none reported.  Patient denies hx of cancer, stroke, seizures, lung problems, heart problems, diabetes, unexplained weight loss, unexplained changes in bowel or bladder problems, unexplained stumbling or dropping things, osteoporosis, and spinal surgery  SUBJECTIVE:                                                                                                                                                                                       SUBJECTIVE STATEMENT:  Patient reports his shoulder has been pretty good and he thinks it is getting a little better. Little things still kind of hurt it. For example on Monday he was doing homework and noticed leaning on it with his hands on the desk bothered his shoulder. He also noticed leaning sideways on his right arm when watching TV bothered it. He has to be there for a few minutes for it to be bothered. The pain is still in the posterior right deltoid region. He states the lower trap also gets pretty sore sometimes as well. His shoulder is still disrupting him when he sleeps. He has been doing his HEP,  which are getting easier.   PAIN:  NPRS: 2/10 posterior right shoulder and right lower trap  PATIENT GOALS: "to be able to sleep on that side again" "be able to do simple tasks without pain" " and "be able to reach up in the air with my arm for a while without pain"  NEXT MD VISIT:    OBJECTIVE  Observed patient positioning himself in painful position lying on shoulder (lying on his right bicep)  Palpation of painful spot on right rear-deltoid.   TODAY'S TREATMENT:    Therapeutic exercise: to centralize symptoms and improve ROM, strength, muscular endurance, and activity tolerance required for successful completion of functional activities.  Upper body ergometer level 10 to encourage joint nutrition, warm tissue, induce analgesic effect of aerobic exercise, improve muscular strength and endurance,  and prepare for remainder of session. 5 min total.   (Manual therapy - see below)  Lat pull:  1x20 at 35# (starts feeling concordant pain at posterior axilla by rep 8, worse after) Decreased pain with rest  1x15 at 35  Hooklying lat pull over:  1x15 with 10#DB held with both hands 1x15 with 5#DB in each hand  (Slow tempo, Mild increase in pain)    Education on HEP including handout (emailed)   Manual therapy: to  reduce pain and tissue tension, improve range of motion, neuromodulation, in order to promote improved ability to complete functional activities. SUPINE/PRONE STM/palpation to posterior R shoulder structures (concordant pain with palpation and sustained pressure to lat dorsi/teres major at posterior axilla, no better with STM), posterior deltoid asymptomatic  PROM to R shoulder in end range flexion and abduction (concordant pain at posterior axilla)  R GJ AP glide with shoulder in slight horizontal abduction, 2x30 seconds, grade IV (no change in concordant pain).    .Pt required multimodal cuing for proper technique and to facilitate improved neuromuscular control, strength, range of motion, and functional ability resulting in improved performance and form.    PATIENT EDUCATION: Education details: Exercise purpose/form. Self management techniques.  Education on HEP including handout. Person educated: Patient Education method: Explanation, Demonstration, and Handouts Education comprehension: verbalized understanding, returned demonstration, and needs further education  HOME EXERCISE PROGRAM: Access Code: [email or web address] URL: [email or web address] Date: 05/13/2023 Prepared by: Norton Blizzard  Exercises - Face Pulls  - 2 sets - 15 reps - 15 lbs weight - Supine Chest Press with Dumbbells  - 3-4 x weekly - 2 sets - 10 reps - Prone Angels  - 3 x weekly - 2 sets - 12 reps - Prone Shoulder Horizontal Abduction with Thumbs Up  - 3 x weekly - 2 sets - 10 reps - Prone Lower Trapezius Strengthening on Swiss Ball with Dumbbells  - 3 x weekly - 10 reps - Half-Kneeling Bottoms Up [employer]  - 3 sets - 8-12 reps - Lat Pull Down - Cable/Bar  - 3 x weekly - 2-3 sets - 15 reps - 35 lbs weight  HOME EXERCISE PROGRAM [[email or web address]] View at [email or web address]" using code: [email or web address] Pull Over -  Repeat 15 Repetitions, Complete 1 Set, Perform 2 Times a Day  ASSESSMENT:  CLINICAL  IMPRESSION:   Patient returns to PT after 1 month away during the holidays. Patient reports mild improvement in pain and good participation in HEP, but he continues to be limited by pain at the posterior right shoulder. Today's session focused on re-consideration of structures involved with movement exam, loading response, and palpation suggesting painful tissue is distal portion of lat dorsi  muscle or teres major. Patient has pain at the posterior axilla the limits end range abduction. He is TTP with concordant pain with pressure to the lat dorsi/teres major at the posterior axilla, and he has concordant pain with exercises that load the lat, such as lat pull over, lat pull, and pull ups. His original mechanism of injury was a single arm row, in which the lat dorsi is a primary mover. Plan to adjust treatment interventions to focus on loading lat for possible tendinopathy. Plan to refer back for further medical evaluation if no improvement in the next few weeks or with worsening more acutely with loading. Patient would benefit from continued management of limiting condition by skilled physical therapist to address remaining impairments and functional limitations to work towards stated goals and return to PLOF or maximal functional independence.  .  From Initial PT evaluation 01/20/2023: Patient is a 23 y.o. male referred to outpatient physical therapy with a medical diagnosis of rotator cuff syndrome of right shoulder who presents with signs and symptoms most consistent with chronic right shoulder pain, most likely rotator cuff related with less suspicion for labra tear. Patient demonstrates painful arc, pain/weakness with R shoulder resisted ER, positive hawkins-kennedy and popping/catching is not a chief compliant. He does appear to have slightly more laxity in the right GHJ compared to the left and pain in the posterior shoulder with anterior glide of GHJ while in maximum ER may suggest internal impingement.  Positive O'brien's test is not corroborated well by other tests or symptoms. Patient would most likely benefit from stabilization and strengthening program for the right shoulder girdle with interventions for pain control and improved muscle function a needed. Patient presents with significant pain, ROM, joint mobility, muscle performance (strength/power/endurance), motor control, and activity tolerance impairments that are limiting ability to complete usual activities such a sleeping, ADLS (brushing teeth, shaving), lifting things like a couch, throwing a foot ball (overhand throwing), anything that requires using the right UE a lot, especially overhead without difficulty. Patient will benefit from skilled physical therapy intervention to address current body structure impairments and activity limitations to improve function and work towards goals set in current POC in order to return to prior level of function or maximal functional improvement.   OBJECTIVE IMPAIRMENTS: decreased activity tolerance, decreased endurance, decreased knowledge of condition, decreased ROM, decreased strength, impaired perceived functional ability, increased muscle spasms, impaired UE functional use, and pain.   ACTIVITY LIMITATIONS: carrying, lifting, sleeping, reach over head, and hygiene/grooming  PARTICIPATION LIMITATIONS: community activity, occupation, school, and   difficulty with sleeping, ADLS (brushing teeth, shaving), lifting things like a couch, throwing a foot ball (overhand throwing), anything that requires using the right UE a lot, especially overhead  PERSONAL FACTORS: Past/current experiences and Time since onset of injury/illness/exacerbation are also affecting patient's functional outcome.   REHAB POTENTIAL: Good  CLINICAL DECISION MAKING: Stable/uncomplicated  EVALUATION COMPLEXITY: Low   GOALS: Goals reviewed with patient? No  SHORT TERM GOALS: Target date: 02/03/2023  Patient will be independent  with initial home exercise program for self-management of symptoms. Baseline: Initial HEP provided at IE (01/20/23); Goal status: In-progress   LONG TERM GOALS: Target date: 04/14/2023. Target date updated to 06/24/2022 for all unmet/new goals on 04/02/2023.   Patient will be independent with a long-term home exercise program for self-management of symptoms.  Baseline: Initial HEP provided at IE (01/20/23); participating well (03/17/2023); participating well (04/02/2023) Goal status: In-progress  2.  Patient will demonstrate improved FOTO to equal  or greater than 80 by visit #10 to demonstrate improvement in overall condition and self-reported functional ability.  Baseline: 62 (01/20/23); 61 at visit #5 (02/09/2023); 62 at visit #11 (04/02/2023) Goal status: In-progress  3.  Patient will demonstrate R shoulder strength equal or greater to L shoulder strength with no increase in pain to improve his ability to use R UE for daily tasks such as overhead work, throwing a football, and grooming tasks with less difficulty.  Baseline: R ER 4+/5 and painful (01/20/23); 5/5 B ER (04/02/23) Goal status: MET  4.  Patient will demonstrate ability to perform 20 consecutive overhead throws at the rebounder with 1kg med ball using R UE without increased pain to improve his ability to play sports, perform repetative overhead activities, and throw a football.  Baseline: reports throwing a football overhand is painful (01/20/23); Did the 20 throws with no pain, but "maybe slight soreness" (04/02/2023) Goal status: MET  5.  Patient will report being able to sleep on his right side without being awoken by right shoulder pain to improve his ability to sleep comfortably.  Baseline: awoken by pain whenever he rolls on the right side (01/20/23); still cannot sleep on that side (04/02/2023) Goal status: In-progress  6. Patient will do 10 bottoms-up 20# kettlebell OH presses with his right arm to improve his overhead  shoulder stability and allow him to "trust his shoulder" when lifting heavy objects and putting them on shelves  Baseline: He did 5 in session with 10# (04/02/2023)  Goal status: In-progress  7. Patient will demonstrate ability to perform 1 minute of consecutive overhead throws at the rebounder with 3kg med ball using R UE without increased pain to improve his ability to play sports, perform repetative overhead activities, and throw a football.   Baseline: He did 20 consecutive throws with 1kg medball and no pain  Goal status: In-progress    PLAN:  PT FREQUENCY: 1-2x/week  PT DURATION: 12 weeks  PLANNED INTERVENTIONS: Therapeutic exercises, Therapeutic activity, Neuromuscular re-education, Patient/Family education, Self Care, Joint mobilization, Dry Needling, Electrical stimulation, Spinal mobilization, Cryotherapy, Moist heat, Manual therapy, and Re-evaluation  PLAN FOR NEXT SESSION: update HEP as appropriate, treat for right latissimus dorsi tendinopathy. Consider dry needling to lat dorsi and/or teres major  Luretha Murphy. Ilsa Iha, PT, DPT 05/14/23, 10:53 PM  [facility] [address] [place], [place] [address] P: [phone] I F: [phone]

## 2023-05-14 ENCOUNTER — Encounter: Payer: Self-pay | Admitting: Physical Therapy

## 2023-05-19 ENCOUNTER — Encounter: Payer: Self-pay | Admitting: Physical Therapy

## 2023-05-19 ENCOUNTER — Ambulatory Visit: Payer: BC Managed Care – PPO | Admitting: Physical Therapy

## 2023-05-19 DIAGNOSIS — M25511 Pain in right shoulder: Secondary | ICD-10-CM | POA: Diagnosis not present

## 2023-05-19 DIAGNOSIS — G8929 Other chronic pain: Secondary | ICD-10-CM

## 2023-05-19 NOTE — Patient Instructions (Signed)

## 2023-05-19 NOTE — Therapy (Signed)
OUTPATIENT PHYSICAL THERAPY TREATMENT  Patient Name: Carlos Lloyd MRN: [id number] DOB:24-Dec-2000, 23 y.o., male Today's Date: 05/19/2023  END OF SESSION:  PT End of Session - 05/19/23 1821     Visit Number 14    Number of Visits 20    Date for PT Re-Evaluation 06/25/23    Authorization Type [employer] COMM PPO reporting period from 03/17/2023    PT Start Time 1820    PT Stop Time 1900    PT Time Calculation (min) 40 min    Activity Tolerance Patient tolerated treatment well    Behavior During Therapy [facility] for tasks assessed/performed              History reviewed. No pertinent past medical history. History reviewed. No pertinent surgical history. There are no active problems to display for this patient.   PCP: Eden Lathe, MD  REFERRING PROVIDER: Noralee Stain, MD   REFERRING DIAG: rotator cuff syndrome of right shoulder  THERAPY DIAG:  Chronic right shoulder pain  Rationale for Evaluation and Treatment: Rehabilitation  ONSET DATE: approx 1 year prior to PT evaluation  PERTINENT HISTORY: Patient is a 23 y.o. male who presents to outpatient physical therapy with a referral for medical diagnosis rotator cuff syndrome of right shoulder. This patient's chief complaints consist of chronic right shoulder pain leading to the following functional deficits: difficulty with sleeping, ADLS (brushing teeth, shaving), lifting things like a couch, throwing a foot ball (overhand throwing), anything that requires using the right UE a lot, especially overhead. Relevant past medical history and comorbidities include none reported.  Patient denies hx of cancer, stroke, seizures, lung problems, heart problems, diabetes, unexplained weight loss, unexplained changes in bowel or bladder problems, unexplained stumbling or dropping things, osteoporosis, and spinal surgery  SUBJECTIVE:                                                                                                                                                                                       SUBJECTIVE STATEMENT:  Patient reports he felt okay after last PT session. He is doing the lat pull over about 2 times per day and it is going well. He states he continues to have similar pain in his posterior right shoulder.   PAIN:  NPRS: 3/10 posterior right shoulder  PATIENT GOALS: "to be able to sleep on that side again" "be able to do simple tasks without pain" " and "be able to reach up in the air with my arm for a while without pain"  NEXT MD VISIT:    OBJECTIVE  Calculated 1RM (based off of <10RM tested in clinic): Lat pull down: 117.2 # (last  tested 05/19/2023)  TODAY'S TREATMENT:    Therapeutic exercise: to centralize symptoms and improve ROM, strength, muscular endurance, and activity tolerance required for successful completion of functional activities.  Upper body ergometer level 10 to encourage joint nutrition, warm tissue, induce analgesic effect of aerobic exercise, improve muscular strength and endurance,  and prepare for remainder of session. 5 min total.   (Manual therapy / dry needling - see below)  Hooklying lat pull over:  1x15 with 5#DB in each hand 1x15 with 7#DB in each hand  1x15 with 10#DB in each hand (Slow tempo, mild increase in pain during set)   Lat pull:  1x15 at 35# Decreased pain with rest  1x15 at 65# 1 min rest  1x7 at 95# (7RM) At least 1 min rest 1x11 at 80# (~70% 1RM)  Education on HEP including handout (emailed)   Manual therapy: to reduce pain and tissue tension, improve range of motion, neuromodulation, in order to promote improved ability to complete functional activities. SUPINE STM to right lat dorsi, teres major, and subscapularis.   Trigger Point Dry Needling  Initial Treatment: Pt instructed on Dry Needling rational, procedures, and possible side effects. Pt instructed to expect mild to moderate muscle soreness later in the day and/or into the next  day.  Pt instructed in methods to reduce muscle soreness. Pt instructed to continue prescribed HEP. Because Dry Needling was performed over or adjacent to a lung field, pt was educated on S/S of pneumothorax and to seek immediate medical attention should they occur.  Patient was educated on signs and symptoms of infection and other risk factors and advised to seek medical attention should they occur.  Patient verbalized understanding of these instructions and education.  Patient Verbal Consent Given: Yes Education Handout Provided: Yes Muscles Treated: 2 dry needle(s) .30mm x 60mm inserted with 2 sticks into right latissimus dorsi muscles with patient in supine position with R shoulder abducted to decrease pain and spasms along patient's right posterior shoulder region. Electrical Stimulation Performed: No Treatment Response/Outcome: Mild twitch response and expected aching where needle inserted. No unexpected response.  Marland KitchenPt required multimodal cuing for proper technique and to facilitate improved neuromuscular control, strength, range of motion, and functional ability resulting in improved performance and form.  PATIENT EDUCATION: Education details: Exercise purpose/form. Self management techniques. . Dry needling Person educated: Patient Education method: Explanation, Demonstration, and Handouts Education comprehension: verbalized understanding, returned demonstration, and needs further education  HOME EXERCISE PROGRAM: Access Code: [email or web address] URL: [email or web address] Date: 05/13/2023 Prepared by: Norton Blizzard  Exercises - Face Pulls  - 2 sets - 15 reps - 15 lbs weight - Supine Chest Press with Dumbbells  - 3-4 x weekly - 2 sets - 10 reps - Prone Angels  - 3 x weekly - 2 sets - 12 reps - Prone Shoulder Horizontal Abduction with Thumbs Up  - 3 x weekly - 2 sets - 10 reps - Prone Lower Trapezius Strengthening on Swiss Ball with Dumbbells  - 3 x weekly - 10 reps -  Half-Kneeling Bottoms Up [employer]  - 3 sets - 8-12 reps - Lat Pull Down - Cable/Bar  - 3 x weekly - 2-3 sets - 15 reps - 35 lbs weight  HOME EXERCISE PROGRAM [[email or web address]] View at [email or web address]" using code: [email or web address] Pull Over -  Repeat 15 Repetitions, Complete 1 Set, Perform 2 Times a Day  ASSESSMENT:  CLINICAL IMPRESSION:   Patient arrives with good tolerance to last PT session and HEP. Today's session  utilized dry needling of right distal latissimus dorsi for pain relief and improve muscle function. Patient experienced deep ache in concordant region of pain with slight muscle twitch with needling. This was followed by slow heavy loading of the lat muscle in supine and seated position with patient able to tolerate progression in load. Patinet's pain increased up to 5/10 during loading but this started to return to baseline during rest periods and patient tolerated it well. HEP updated verbally accordingly. Plan to continue with similar interventions, progressed as appropriate at next visit. Patient would benefit from continued management of limiting condition by skilled physical therapist to address remaining impairments and functional limitations to work towards stated goals and return to PLOF or maximal functional independence.  .  From Initial PT evaluation 01/20/2023: Patient is a 23 y.o. male referred to outpatient physical therapy with a medical diagnosis of rotator cuff syndrome of right shoulder who presents with signs and symptoms most consistent with chronic right shoulder pain, most likely rotator cuff related with less suspicion for labra tear. Patient demonstrates painful arc, pain/weakness with R shoulder resisted ER, positive hawkins-kennedy and popping/catching is not a chief compliant. He does appear to have slightly more laxity in the right GHJ compared to the left and pain in the posterior shoulder with anterior glide of GHJ while in maximum ER may suggest internal  impingement. Positive O'brien's test is not corroborated well by other tests or symptoms. Patient would most likely benefit from stabilization and strengthening program for the right shoulder girdle with interventions for pain control and improved muscle function a needed. Patient presents with significant pain, ROM, joint mobility, muscle performance (strength/power/endurance), motor control, and activity tolerance impairments that are limiting ability to complete usual activities such a sleeping, ADLS (brushing teeth, shaving), lifting things like a couch, throwing a foot ball (overhand throwing), anything that requires using the right UE a lot, especially overhead without difficulty. Patient will benefit from skilled physical therapy intervention to address current body structure impairments and activity limitations to improve function and work towards goals set in current POC in order to return to prior level of function or maximal functional improvement.   OBJECTIVE IMPAIRMENTS: decreased activity tolerance, decreased endurance, decreased knowledge of condition, decreased ROM, decreased strength, impaired perceived functional ability, increased muscle spasms, impaired UE functional use, and pain.   ACTIVITY LIMITATIONS: carrying, lifting, sleeping, reach over head, and hygiene/grooming  PARTICIPATION LIMITATIONS: community activity, occupation, school, and   difficulty with sleeping, ADLS (brushing teeth, shaving), lifting things like a couch, throwing a foot ball (overhand throwing), anything that requires using the right UE a lot, especially overhead  PERSONAL FACTORS: Past/current experiences and Time since onset of injury/illness/exacerbation are also affecting patient's functional outcome.   REHAB POTENTIAL: Good  CLINICAL DECISION MAKING: Stable/uncomplicated  EVALUATION COMPLEXITY: Low   GOALS: Goals reviewed with patient? No  SHORT TERM GOALS: Target date: 02/03/2023  Patient will  be independent with initial home exercise program for self-management of symptoms. Baseline: Initial HEP provided at IE (01/20/23); Goal status: In-progress   LONG TERM GOALS: Target date: 04/14/2023. Target date updated to 06/24/2022 for all unmet/new goals on 04/02/2023.   Patient will be independent with a long-term home exercise program for self-management of symptoms.  Baseline: Initial HEP provided at IE (01/20/23); participating well (03/17/2023); participating well (04/02/2023) Goal status: In-progress  2.  Patient will demonstrate improved FOTO to equal or greater than 80 by visit #10 to demonstrate improvement in overall condition and self-reported functional  ability.  Baseline: 62 (01/20/23); 61 at visit #5 (02/09/2023); 62 at visit #11 (04/02/2023) Goal status: In-progress  3.  Patient will demonstrate R shoulder strength equal or greater to L shoulder strength with no increase in pain to improve his ability to use R UE for daily tasks such as overhead work, throwing a football, and grooming tasks with less difficulty.  Baseline: R ER 4+/5 and painful (01/20/23); 5/5 B ER (04/02/23) Goal status: MET  4.  Patient will demonstrate ability to perform 20 consecutive overhead throws at the rebounder with 1kg med ball using R UE without increased pain to improve his ability to play sports, perform repetative overhead activities, and throw a football.  Baseline: reports throwing a football overhand is painful (01/20/23); Did the 20 throws with no pain, but "maybe slight soreness" (04/02/2023) Goal status: MET  5.  Patient will report being able to sleep on his right side without being awoken by right shoulder pain to improve his ability to sleep comfortably.  Baseline: awoken by pain whenever he rolls on the right side (01/20/23); still cannot sleep on that side (04/02/2023) Goal status: In-progress  6. Patient will do 10 bottoms-up 20# kettlebell [place] presses with his right arm to improve his  overhead shoulder stability and allow him to "trust his shoulder" when lifting heavy objects and putting them on shelves  Baseline: He did 5 in session with 10# (04/02/2023)  Goal status: In-progress  7. Patient will demonstrate ability to perform 1 minute of consecutive overhead throws at the rebounder with 3kg med ball using R UE without increased pain to improve his ability to play sports, perform repetative overhead activities, and throw a football.   Baseline: He did 20 consecutive throws with 1kg medball and no pain  Goal status: In-progress    PLAN:  PT FREQUENCY: 1-2x/week  PT DURATION: 12 weeks  PLANNED INTERVENTIONS: Therapeutic exercises, Therapeutic activity, Neuromuscular re-education, Patient/Family education, Self Care, Joint mobilization, Dry Needling, Electrical stimulation, Spinal mobilization, Cryotherapy, Moist heat, Manual therapy, and Re-evaluation  PLAN FOR NEXT SESSION: update HEP as appropriate, treat for right latissimus dorsi tendinopathy. Consider dry needling to lat dorsi and/or teres major  Luretha Murphy. Ilsa Iha, PT, DPT 05/19/23, 7:07 PM  [facility] [address] [place], [place] [address] P: [phone] I F: [phone]

## 2023-05-28 ENCOUNTER — Ambulatory Visit: Payer: BC Managed Care – PPO | Admitting: Physical Therapy

## 2023-05-28 ENCOUNTER — Encounter: Payer: Self-pay | Admitting: Physical Therapy

## 2023-05-28 DIAGNOSIS — G8929 Other chronic pain: Secondary | ICD-10-CM

## 2023-05-28 DIAGNOSIS — M25511 Pain in right shoulder: Secondary | ICD-10-CM | POA: Diagnosis not present

## 2023-05-28 NOTE — Therapy (Signed)
OUTPATIENT PHYSICAL THERAPY TREATMENT  Patient Name: Carlos Lloyd MRN: [id number] DOB:02-15-2001, 23 y.o., male Today's Date: 05/28/2023  END OF SESSION:  PT End of Session - 05/28/23 1950     Visit Number 15    Number of Visits 20    Date for PT Re-Evaluation 06/25/23    Authorization Type [employer] COMM PPO reporting period from 03/17/2023    PT Start Time 1820    PT Stop Time 1900    PT Time Calculation (min) 40 min    Activity Tolerance Patient tolerated treatment well    Behavior During Therapy [facility] for tasks assessed/performed               History reviewed. No pertinent past medical history. History reviewed. No pertinent surgical history. There are no active problems to display for this patient.   PCP: Eden Lathe, MD  REFERRING PROVIDER: Noralee Stain, MD   REFERRING DIAG: rotator cuff syndrome of right shoulder  THERAPY DIAG:  Chronic right shoulder pain  Rationale for Evaluation and Treatment: Rehabilitation  ONSET DATE: approx 1 year prior to PT evaluation  PERTINENT HISTORY: Patient is a 23 y.o. male who presents to outpatient physical therapy with a referral for medical diagnosis rotator cuff syndrome of right shoulder. This patient's chief complaints consist of chronic right shoulder pain leading to the following functional deficits: difficulty with sleeping, ADLS (brushing teeth, shaving), lifting things like a couch, throwing a foot ball (overhand throwing), anything that requires using the right UE a lot, especially overhead. Relevant past medical history and comorbidities include none reported.  Patient denies hx of cancer, stroke, seizures, lung problems, heart problems, diabetes, unexplained weight loss, unexplained changes in bowel or bladder problems, unexplained stumbling or dropping things, osteoporosis, and spinal surgery  SUBJECTIVE:                                                                                                                                                                                       SUBJECTIVE STATEMENT:  Patient reports he might have had a small improvement in pain for about 2 days following last PT session, but it was less than 1 point on the NPRS. He had no excessive soreness or pain after last PT session. He states the pain still disrupts his sleep and is in the same spot. He states his shoulder has been feeling about the same. He has been using 75# for the lat pull down.    PAIN:  NPRS: 3/10 posterior right shoulder  PATIENT GOALS: "to be able to sleep on that side again" "be able to do simple tasks without pain" " and "be able to  reach up in the air with my arm for a while without pain"  NEXT MD VISIT:    OBJECTIVE  Calculated 1RM (based off of <10RM tested in clinic): Lat pull down: 117.2 # (last tested 05/19/2023)  Bent over single arm row at cable machine (bracing with contralateral arm on seat); 63# (last measured 05/28/2023);     TODAY'S TREATMENT:    Therapeutic exercise: to centralize symptoms and improve ROM, strength, muscular endurance, and activity tolerance required for successful completion of functional activities.  Upper body ergometer level 10 to encourage joint nutrition, warm tissue, induce analgesic effect of aerobic exercise, improve muscular strength and endurance,  and prepare for remainder of session. 5 min total.   Standing right shoulder abduction AAROM stretch with foam roller up the wall, 1x20   Sidelying R lat foam roll  1x40 seconds  Sidelying R self STM by sidelying with lat on foam roller, while moving R shoulder in abduction AROM:  1x20  Feels looser afterwards  Hooklying single arm  lat pull over:  3x15 with 15#DB each side (Slow tempo, mild increase in pain during set)   Lat pull:  1x20 at 80# (~70% 1RM) 1 min rest 1x12 at 90# (~77% 1RM) 1 min rest 1x13 at 90# (~77% 1RM)  Bent over R single arm row with low cable and contralateral UE braced  on seat with yoga block increasing height 1x6 at 45# > 1 min rest 1x12 at 45# > 1 min rest 1x20 at 30#   Education on HEP including handout   Pt required multimodal cuing for proper technique and to facilitate improved neuromuscular control, strength, range of motion, and functional ability resulting in improved performance and form.  PATIENT EDUCATION: Education details: Exercise purpose/form. Self management techniques. . Dry needling Person educated: Patient Education method: Explanation, Demonstration, and Handouts Education comprehension: verbalized understanding, returned demonstration, and needs further education  HOME EXERCISE PROGRAM: Access Code: [email or web address] URL: [email or web address] Date: 05/13/2023 Prepared by: Norton Blizzard  Exercises - Face Pulls  - 2 sets - 15 reps - 15 lbs weight - Supine Chest Press with Dumbbells  - 3-4 x weekly - 2 sets - 10 reps - Prone Angels  - 3 x weekly - 2 sets - 12 reps - Prone Shoulder Horizontal Abduction with Thumbs Up  - 3 x weekly - 2 sets - 10 reps - Prone Lower Trapezius Strengthening on Swiss Ball with Dumbbells  - 3 x weekly - 10 reps - Half-Kneeling Bottoms Up [employer]  - 3 sets - 8-12 reps - Lat Pull Down - Cable/Bar  - 3 x weekly - 2-3 sets - 15 reps - 35 lbs weight  HOME EXERCISE PROGRAM [[id number]] View at [email or web address]" using code: [email or web address] Pull Over -  Repeat 15 Repetitions, Complete 1 Set, Perform 2 Times a Day  HOME EXERCISE PROGRAM [[email or web address]] View at "[email or web address]" using code: [email or web address] S/L Subscap/Lat STM with Foam Roller -   Bent Over Row  -  Repeat 20 Repetitions, Complete 3 Sets, Perform 3 Times a Week  ASSESSMENT:  CLINICAL IMPRESSION:   Patient arrives with continued good tolerance to HEP and last PT session but no meaningful improvement in condition. He was able to progress to heavier loads today through the right lat with increased volume and variety added to  exercises. Foam roller was utilized to help decrease tension with mild positive response noted afterwards. Dry needling was not repeated due to lackluster response. Plan to continue with heavy slow loading of  the lat tendon as tolerated. HEP was updated to include increased load and load angles. Patient reported his concordant pain felt slightly better by end of session.  Patient would benefit from continued management of limiting condition by skilled physical therapist to address remaining impairments and functional limitations to work towards stated goals and return to PLOF or maximal functional independence.  .  From Initial PT evaluation 01/20/2023: Patient is a 23 y.o. male referred to outpatient physical therapy with a medical diagnosis of rotator cuff syndrome of right shoulder who presents with signs and symptoms most consistent with chronic right shoulder pain, most likely rotator cuff related with less suspicion for labra tear. Patient demonstrates painful arc, pain/weakness with R shoulder resisted ER, positive hawkins-kennedy and popping/catching is not a chief compliant. He does appear to have slightly more laxity in the right GHJ compared to the left and pain in the posterior shoulder with anterior glide of GHJ while in maximum ER may suggest internal impingement. Positive O'brien's test is not corroborated well by other tests or symptoms. Patient would most likely benefit from stabilization and strengthening program for the right shoulder girdle with interventions for pain control and improved muscle function a needed. Patient presents with significant pain, ROM, joint mobility, muscle performance (strength/power/endurance), motor control, and activity tolerance impairments that are limiting ability to complete usual activities such a sleeping, ADLS (brushing teeth, shaving), lifting things like a couch, throwing a foot ball (overhand throwing), anything that requires using the right UE a lot,  especially overhead without difficulty. Patient will benefit from skilled physical therapy intervention to address current body structure impairments and activity limitations to improve function and work towards goals set in current POC in order to return to prior level of function or maximal functional improvement.   OBJECTIVE IMPAIRMENTS: decreased activity tolerance, decreased endurance, decreased knowledge of condition, decreased ROM, decreased strength, impaired perceived functional ability, increased muscle spasms, impaired UE functional use, and pain.   ACTIVITY LIMITATIONS: carrying, lifting, sleeping, reach over head, and hygiene/grooming  PARTICIPATION LIMITATIONS: community activity, occupation, school, and   difficulty with sleeping, ADLS (brushing teeth, shaving), lifting things like a couch, throwing a foot ball (overhand throwing), anything that requires using the right UE a lot, especially overhead  PERSONAL FACTORS: Past/current experiences and Time since onset of injury/illness/exacerbation are also affecting patient's functional outcome.   REHAB POTENTIAL: Good  CLINICAL DECISION MAKING: Stable/uncomplicated  EVALUATION COMPLEXITY: Low   GOALS: Goals reviewed with patient? No  SHORT TERM GOALS: Target date: 02/03/2023  Patient will be independent with initial home exercise program for self-management of symptoms. Baseline: Initial HEP provided at IE (01/20/23); Goal status: In-progress   LONG TERM GOALS: Target date: 04/14/2023. Target date updated to 06/24/2022 for all unmet/new goals on 04/02/2023.   Patient will be independent with a long-term home exercise program for self-management of symptoms.  Baseline: Initial HEP provided at IE (01/20/23); participating well (03/17/2023); participating well (04/02/2023) Goal status: In-progress  2.  Patient will demonstrate improved FOTO to equal or greater than 80 by visit #10 to demonstrate improvement in overall condition  and self-reported functional ability.  Baseline: 62 (01/20/23); 61 at visit #5 (02/09/2023); 62 at visit #11 (04/02/2023) Goal status: In-progress  3.  Patient will demonstrate R shoulder strength equal or greater to L shoulder strength with no increase in pain to improve his ability to use R UE for daily tasks such as overhead work, throwing a football, and grooming tasks with less difficulty.  Baseline:  R ER 4+/5 and painful (01/20/23); 5/5 B ER (04/02/23) Goal status: MET  4.  Patient will demonstrate ability to perform 20 consecutive overhead throws at the rebounder with 1kg med ball using R UE without increased pain to improve his ability to play sports, perform repetative overhead activities, and throw a football.  Baseline: reports throwing a football overhand is painful (01/20/23); Did the 20 throws with no pain, but "maybe slight soreness" (04/02/2023) Goal status: MET  5.  Patient will report being able to sleep on his right side without being awoken by right shoulder pain to improve his ability to sleep comfortably.  Baseline: awoken by pain whenever he rolls on the right side (01/20/23); still cannot sleep on that side (04/02/2023) Goal status: In-progress  6. Patient will do 10 bottoms-up 20# kettlebell OH presses with his right arm to improve his overhead shoulder stability and allow him to "trust his shoulder" when lifting heavy objects and putting them on shelves  Baseline: He did 5 in session with 10# (04/02/2023)  Goal status: In-progress  7. Patient will demonstrate ability to perform 1 minute of consecutive overhead throws at the rebounder with 3kg med ball using R UE without increased pain to improve his ability to play sports, perform repetative overhead activities, and throw a football.   Baseline: He did 20 consecutive throws with 1kg medball and no pain  Goal status: In-progress    PLAN:  PT FREQUENCY: 1-2x/week  PT DURATION: 12 weeks  PLANNED INTERVENTIONS:  Therapeutic exercises, Therapeutic activity, Neuromuscular re-education, Patient/Family education, Self Care, Joint mobilization, Dry Needling, Electrical stimulation, Spinal mobilization, Cryotherapy, Moist heat, Manual therapy, and Re-evaluation  PLAN FOR NEXT SESSION: update HEP as appropriate, treat for right latissimus dorsi tendinopathy. Consider dry needling to lat dorsi and/or teres major  Luretha Murphy. Ilsa Iha, PT, DPT 05/28/23, 7:54 PM  [facility] [address] [place], [place] [address] P: [phone] I F: [phone]

## 2023-06-04 ENCOUNTER — Encounter: Payer: Self-pay | Admitting: Physical Therapy

## 2023-06-04 ENCOUNTER — Ambulatory Visit: Payer: BC Managed Care – PPO | Attending: Family Medicine | Admitting: Physical Therapy

## 2023-06-04 DIAGNOSIS — M25511 Pain in right shoulder: Secondary | ICD-10-CM | POA: Diagnosis present

## 2023-06-04 DIAGNOSIS — G8929 Other chronic pain: Secondary | ICD-10-CM | POA: Insufficient documentation

## 2023-06-04 NOTE — Therapy (Signed)
 OUTPATIENT PHYSICAL THERAPY TREATMENT  Patient Name: Carlos Lloyd MRN: [id number] DOB:Dec 05, 2000, 23 y.o., male Today's Date: 06/04/2023  END OF SESSION:  PT End of Session - 06/04/23 1805     Visit Number 16    Number of Visits 20    Date for PT Re-Evaluation 06/25/23    Authorization Type [employer] COMM PPO reporting period from 03/17/2023    PT Start Time 1649    PT Stop Time 1729    PT Time Calculation (min) 40 min    Activity Tolerance Patient tolerated treatment well    Behavior During Therapy [facility] for tasks assessed/performed              History reviewed. No pertinent past medical history. History reviewed. No pertinent surgical history. There are no active problems to display for this patient.   PCP: Ezzard Arleen SAILOR, MD  REFERRING PROVIDER: Benay Mellow, MD   REFERRING DIAG: rotator cuff syndrome of right shoulder  THERAPY DIAG:  Chronic right shoulder pain  Rationale for Evaluation and Treatment: Rehabilitation  ONSET DATE: approx 1 year prior to PT evaluation  PERTINENT HISTORY: Patient is a 23 y.o. male who presents to outpatient physical therapy with a referral for medical diagnosis rotator cuff syndrome of right shoulder. This patient's chief complaints consist of chronic right shoulder pain leading to the following functional deficits: difficulty with sleeping, ADLS (brushing teeth, shaving), lifting things like a couch, throwing a foot ball (overhand throwing), anything that requires using the right UE a lot, especially overhead. Relevant past medical history and comorbidities include none reported.  Patient denies hx of cancer, stroke, seizures, lung problems, heart problems, diabetes, unexplained weight loss, unexplained changes in bowel or bladder problems, unexplained stumbling or dropping things, osteoporosis, and spinal surgery  SUBJECTIVE:                                                                                                                                                                                       SUBJECTIVE STATEMENT:  Patient states his right shoulder is starting to feel a little bit better. The pain is better and it has not recently woken him up at night, but he thinks his body has programmed itself to stay on his left side. HEP is going well and he felt okay after last PT session. He has not been doing anything new besides the changes in HEP from PT.   PAIN:  NPRS: 2/10 posterior right shoulder  PATIENT GOALS: to be able to sleep on that side again be able to do simple tasks without pain  and be able to reach up in the air with my arm for  a while without pain  NEXT MD VISIT:   OBJECTIVE  Calculated 1RM (based off of <10RM tested in clinic): Lat pull down: 117.2 # (last tested 05/19/2023)  Bent over single arm row at cable machine (bracing with contralateral arm on seat); 63# (last measured 05/28/2023);     TODAY'S TREATMENT:    Therapeutic exercise: to centralize symptoms and improve ROM, strength, muscular endurance, and activity tolerance required for successful completion of functional activities.  Upper body ergometer level 10 to encourage joint nutrition, warm tissue, induce analgesic effect of aerobic exercise, improve muscular strength and endurance,  and prepare for remainder of session. 5 min total.   Standing right shoulder abduction AAROM stretch with foam roller up the wall, 1x20   Sidelying R lat foam roll  1x60 seconds  Sidelying R self STM by sidelying with lat on foam roller, while moving R shoulder in abduction AROM:  1x20   Therapeutic activities: dynamic therapeutic activities incorporating MULTIPLE parameters or areas of the body designed to achieve improved functional performance.  Hooklying single arm  lat pull over to improve ability to perform pulling and throwing activities 1x15 with 15#DB B UE 1 min rest 1x15 with 20#KB each side  1x13/10 with 20#KB R/L (Slow  tempo)  Lat pull to improve ability to perform pulling activities: 1x15 at 90# (~77% 1RM) 1 min rest (Pull ups - see below) 1 min rest 1x12 at 95# (~81% 1RM) 1 min rest 1x11 at 95# (~81% 1RM)  Pull up to improve ability to perform pulling activities:  1x10 (max)  Bent over R single arm row with low cable and contralateral UE braced on seat with yoga block increasing height to improve ability to pick things up and complete pulling activities: 1x20 at 35# each side 1x11 each side at 45# (~71% 1RM) 1x15 each side at 40# (~63%1RM)  Verbal update on HEP   Pt required multimodal cuing for proper technique and to facilitate improved neuromuscular control, strength, range of motion, and functional ability resulting in improved performance and form.  PATIENT EDUCATION: Education details: Exercise purpose/form. Self management techniques. . Dry needling Person educated: Patient Education method: Explanation, Demonstration, and Handouts Education comprehension: verbalized understanding, returned demonstration, and needs further education  HOME EXERCISE PROGRAM: Access Code: [email or web address] URL: [email or web address] Date: 05/13/2023 Prepared by: Camie Cleverly  Exercises - Face Pulls  - 2 sets - 15 reps - 15 lbs weight - Supine Chest Press with Dumbbells  - 3-4 x weekly - 2 sets - 10 reps - Prone Angels  - 3 x weekly - 2 sets - 12 reps - Prone Shoulder Horizontal Abduction with Thumbs Up  - 3 x weekly - 2 sets - 10 reps - Prone Lower Trapezius Strengthening on Swiss Ball with Dumbbells  - 3 x weekly - 10 reps - Half-Kneeling Bottoms Up Oneok  - 3 sets - 8-12 reps - Lat Pull Down - Cable/Bar  - 3 x weekly - 2-3 sets - 15 reps - 35 lbs weight  HOME EXERCISE PROGRAM [[id number]] View at [email or web address] using code: [email or web address] Pull Over -  Repeat 15 Repetitions, Complete 1 Set, Perform 2 Times a Day  HOME EXERCISE PROGRAM [[email or web address]] View at  [email or web address] using code: [email or web address] S/L Subscap/Lat STM with Foam Roller -   Bent Over Row  -  Repeat 20 Repetitions, Complete 3 Sets, Perform 3 Times a Week  ASSESSMENT:  CLINICAL IMPRESSION:   Patient arrives with improvement in pain and function since last PT session.  He was able to progress load in multiple exercises today and confidently complete pull ups without pain. He did not experience any increase in pain today and HEP was updated to keep up with his progressions in load tolerance. He appears to be responding for heavy slow loading of the right latissimus dorsi muscle for chronic tendinopathy, but has not yet returned to PLOF. Plan to continue with current focus of care with progressions as appropriate next session. Patient would benefit from continued management of limiting condition by skilled physical therapist to address remaining impairments and functional limitations to work towards stated goals and return to PLOF or maximal functional independence.  .  From Initial PT evaluation 01/20/2023: Patient is a 23 y.o. male referred to outpatient physical therapy with a medical diagnosis of rotator cuff syndrome of right shoulder who presents with signs and symptoms most consistent with chronic right shoulder pain, most likely rotator cuff related with less suspicion for labra tear. Patient demonstrates painful arc, pain/weakness with R shoulder resisted ER, positive hawkins-kennedy and popping/catching is not a chief compliant. He does appear to have slightly more laxity in the right GHJ compared to the left and pain in the posterior shoulder with anterior glide of GHJ while in maximum ER may suggest internal impingement. Positive O'brien's test is not corroborated well by other tests or symptoms. Patient would most likely benefit from stabilization and strengthening program for the right shoulder girdle with interventions for pain control and improved muscle function a needed. Patient  presents with significant pain, ROM, joint mobility, muscle performance (strength/power/endurance), motor control, and activity tolerance impairments that are limiting ability to complete usual activities such a sleeping, ADLS (brushing teeth, shaving), lifting things like a couch, throwing a foot ball (overhand throwing), anything that requires using the right UE a lot, especially overhead without difficulty. Patient will benefit from skilled physical therapy intervention to address current body structure impairments and activity limitations to improve function and work towards goals set in current POC in order to return to prior level of function or maximal functional improvement.   OBJECTIVE IMPAIRMENTS: decreased activity tolerance, decreased endurance, decreased knowledge of condition, decreased ROM, decreased strength, impaired perceived functional ability, increased muscle spasms, impaired UE functional use, and pain.   ACTIVITY LIMITATIONS: carrying, lifting, sleeping, reach over head, and hygiene/grooming  PARTICIPATION LIMITATIONS: community activity, occupation, school, and   difficulty with sleeping, ADLS (brushing teeth, shaving), lifting things like a couch, throwing a foot ball (overhand throwing), anything that requires using the right UE a lot, especially overhead  PERSONAL FACTORS: Past/current experiences and Time since onset of injury/illness/exacerbation are also affecting patient's functional outcome.   REHAB POTENTIAL: Good  CLINICAL DECISION MAKING: Stable/uncomplicated  EVALUATION COMPLEXITY: Low   GOALS: Goals reviewed with patient? No  SHORT TERM GOALS: Target date: 02/03/2023  Patient will be independent with initial home exercise program for self-management of symptoms. Baseline: Initial HEP provided at IE (01/20/23); Goal status: In-progress   LONG TERM GOALS: Target date: 04/14/2023. Target date updated to 06/24/2022 for all unmet/new goals on 04/02/2023.    Patient will be independent with a long-term home exercise program for self-management of symptoms.  Baseline: Initial HEP provided at IE (01/20/23); participating well (03/17/2023); participating well (04/02/2023) Goal status: In-progress  2.  Patient will demonstrate improved FOTO to equal or greater than 80 by visit #10 to demonstrate improvement in overall condition and self-reported functional ability.  Baseline: 62 (01/20/23); 61 at visit #5 (02/09/2023); 62 at visit #11 (04/02/2023)  Goal status: In-progress  3.  Patient will demonstrate R shoulder strength equal or greater to L shoulder strength with no increase in pain to improve his ability to use R UE for daily tasks such as overhead work, throwing a football, and grooming tasks with less difficulty.  Baseline: R ER 4+/5 and painful (01/20/23); 5/5 B ER (04/02/23) Goal status: MET  4.  Patient will demonstrate ability to perform 20 consecutive overhead throws at the rebounder with 1kg med ball using R UE without increased pain to improve his ability to play sports, perform repetative overhead activities, and throw a football.  Baseline: reports throwing a football overhand is painful (01/20/23); Did the 20 throws with no pain, but maybe slight soreness (04/02/2023) Goal status: MET  5.  Patient will report being able to sleep on his right side without being awoken by right shoulder pain to improve his ability to sleep comfortably.  Baseline: awoken by pain whenever he rolls on the right side (01/20/23); still cannot sleep on that side (04/02/2023) Goal status: In-progress  6. Patient will do 10 bottoms-up 20# kettlebell OH presses with his right arm to improve his overhead shoulder stability and allow him to trust his shoulder when lifting heavy objects and putting them on shelves  Baseline: He did 5 in session with 10# (04/02/2023)  Goal status: In-progress  7. Patient will demonstrate ability to perform 1 minute of consecutive  overhead throws at the rebounder with 3kg med ball using R UE without increased pain to improve his ability to play sports, perform repetative overhead activities, and throw a football.   Baseline: He did 20 consecutive throws with 1kg medball and no pain  Goal status: In-progress    PLAN:  PT FREQUENCY: 1-2x/week  PT DURATION: 12 weeks  PLANNED INTERVENTIONS: Therapeutic exercises, Therapeutic activity, Neuromuscular re-education, Patient/Family education, Self Care, Joint mobilization, Dry Needling, Electrical stimulation, Spinal mobilization, Cryotherapy, Moist heat, Manual therapy, and Re-evaluation  PLAN FOR NEXT SESSION: update HEP as appropriate, treat for right latissimus dorsi tendinopathy. Consider dry needling to lat dorsi and/or teres major  Camie SAUNDERS. Juli, PT, DPT 06/04/23, 6:11 PM  [facility] [address] [place], [place] [address] P: [phone] I F: [phone]

## 2023-06-10 ENCOUNTER — Ambulatory Visit: Payer: BC Managed Care – PPO | Admitting: Physical Therapy

## 2023-06-15 ENCOUNTER — Encounter: Payer: Self-pay | Admitting: Physical Therapy

## 2023-06-15 ENCOUNTER — Ambulatory Visit: Payer: BC Managed Care – PPO | Admitting: Physical Therapy

## 2023-06-15 DIAGNOSIS — M25511 Pain in right shoulder: Secondary | ICD-10-CM | POA: Diagnosis not present

## 2023-06-15 DIAGNOSIS — G8929 Other chronic pain: Secondary | ICD-10-CM

## 2023-06-15 NOTE — Therapy (Unsigned)
 OUTPATIENT PHYSICAL THERAPY TREATMENT  Patient Name: Carlos Lloyd MRN: [id number] DOB:October 03, 2000, 23 y.o., male Today's Date: 06/16/2023  END OF SESSION:  PT End of Session - 06/15/23 1824     Visit Number 17    Number of Visits 20    Date for PT Re-Evaluation 06/25/23    Authorization Type [employer] COMM PPO reporting period from 03/17/2023    PT Start Time 1730    PT Stop Time 1819    PT Time Calculation (min) 49 min    Activity Tolerance Patient tolerated treatment well    Behavior During Therapy [facility] for tasks assessed/performed               History reviewed. No pertinent past medical history. History reviewed. No pertinent surgical history. There are no active problems to display for this patient.   PCP: Eden Lathe, MD  REFERRING PROVIDER: Noralee Stain, MD   REFERRING DIAG: rotator cuff syndrome of right shoulder  THERAPY DIAG:  Chronic right shoulder pain  Rationale for Evaluation and Treatment: Rehabilitation  ONSET DATE: approx 1 year prior to PT evaluation  PERTINENT HISTORY: Patient is a 23 y.o. male who presents to outpatient physical therapy with a referral for medical diagnosis rotator cuff syndrome of right shoulder. This patient's chief complaints consist of chronic right shoulder pain leading to the following functional deficits: difficulty with sleeping, ADLS (brushing teeth, shaving), lifting things like a couch, throwing a foot ball (overhand throwing), anything that requires using the right UE a lot, especially overhead. Relevant past medical history and comorbidities include none reported.  Patient denies hx of cancer, stroke, seizures, lung problems, heart problems, diabetes, unexplained weight loss, unexplained changes in bowel or bladder problems, unexplained stumbling or dropping things, osteoporosis, and spinal surgery  SUBJECTIVE:                                                                                                                                                                                       SUBJECTIVE STATEMENT:  He is feeling pretty good today  Feeling better overall  Sore today from being in class and leaning on right arm  Not really waking him up from sleeping anymore  He is avoiding sleeping on the right side  HEP has been going well - exercises are feeling good  Tried some assisted pull ups - they felt "okay"   PAIN:  NPRS: 2/10 posterior right shoulder  PATIENT GOALS: "to be able to sleep on that side again" "be able to do simple tasks without pain" " and "be able to reach up in the air with my arm for a while without pain"  NEXT MD VISIT:   OBJECTIVE  Calculated 1RM (based off of <10RM tested in clinic): Lat pull down: 117.2 # (last tested 05/19/2023)  Bent over single arm row at cable machine (bracing with contralateral arm on seat); 63# (last measured 05/28/2023);     TODAY'S TREATMENT:    Therapeutic exercise: to centralize symptoms and improve ROM, strength, muscular endurance, and activity tolerance required for successful completion of functional activities.  Upper body ergometer level 10 to encourage joint nutrition, warm tissue, induce analgesic effect of aerobic exercise, improve muscular strength and endurance,  and prepare for remainder of session. 5 min total.   Standing right shoulder abduction AAROM stretch with foam roller up the wall, 1x20   Sidelying R lat foam roll  1x60 seconds  Sidelying R self STM by sidelying with lat on foam roller, while moving R shoulder in abduction AROM:  1x20   Child's Pose with side bending   5 seconds x 5 to each side   Education on HEP including handout (child's pose with side bending)   Therapeutic activities: dynamic therapeutic activities incorporating MULTIPLE parameters or areas of the body designed to achieve improved functional performance.  Hooklying single arm lat pull over to improve ability to perform pulling and throwing  activities 2x15 each side with 20#KB (Slow tempo)  Bent over R single arm row with low cable and contralateral UE braced on seat with yoga block increasing height to improve ability to pick things up and complete pulling activities: 2x15 each side at 45# (~71% 1RM) 1 min rest between sets  Lat pull to improve ability to perform pulling activities: 1x14 at 90# (~77% 1RM) 1 min rest 1x12 at 90# (~77% 1RM) 1 min rest 1x15 at 90# (~77% 1RM)  Negative pull ups to improve ability to perform pulling activities:  1x10  (Hopping to pull up position from 8 inch step, slowly lowering down)    Pt required multimodal cuing for proper technique and to facilitate improved neuromuscular control, strength, range of motion, and functional ability resulting in improved performance and form.   PATIENT EDUCATION: Education details: Exercise purpose/form. Self management techniques. Dry needling Person educated: Patient Education method: Explanation, Demonstration, and Handouts Education comprehension: verbalized understanding, returned demonstration, and needs further education  HOME EXERCISE PROGRAM: Access Code: [email or web address] URL: [email or web address] Date: 06/15/2023 Prepared by: Kanon Novosel [place]  Exercises - Face Pulls  - 2 sets - 15 reps - 15 lbs weight - Supine Chest Press with Dumbbells  - 3-4 x weekly - 2 sets - 10 reps - Prone Angels  - 3 x weekly - 2 sets - 12 reps - Prone Shoulder Horizontal Abduction with Thumbs Up  - 3 x weekly - 2 sets - 10 reps - Prone Lower Trapezius Strengthening on Swiss Ball with Dumbbells  - 3 x weekly - 10 reps - Half-Kneeling Bottoms Up [employer]  - 3 sets - 8-12 reps - Lat Pull Down - Cable/Bar  - 3 x weekly - 2-3 sets - 15 reps - 35 lbs weight - Child's Pose with Sidebending  - 1 x daily - 1 sets - 10 reps - 5 seconds hold  HOME EXERCISE PROGRAM [[id number]] View at [email or web address]" using code: [email or web address] Pull Over -  Repeat 15  Repetitions, Complete 1 Set, Perform 2 Times a Day  HOME EXERCISE PROGRAM [[email or web address]] View at "[email or web address]" using code: [email or web address] S/L Subscap/Lat STM with Foam Roller -   Bent Over Row  -  Repeat 20 Repetitions, Complete 3 Sets,  Perform 3 Times a Week  ASSESSMENT:  CLINICAL IMPRESSION:   Patient arrives to session reporting continued overall improvement in pain and functional abilities. He has continued good participation in his HEP. The focus of today's session was to continue increasing ROM of the UE and progressing loading to the latissimus dorsi muscles as tolerated to improve strength/endurance. Patient tolerated interventions well with periodic rest breaks throughout therapeutic activities and was able to complete all exercises with no change in pain. Patient provided an updated HEP/handout to include child's pose with side bending. Patient reports feeling good at the end of session with improvement in pain to 1/10 NPRS. Patient would benefit from continued management of limiting condition by skilled physical therapist to address remaining impairments and functional limitations to work towards stated goals and return to PLOF or maximal functional independence.    From Initial PT evaluation 01/20/2023: Patient is a 23 y.o. male referred to outpatient physical therapy with a medical diagnosis of rotator cuff syndrome of right shoulder who presents with signs and symptoms most consistent with chronic right shoulder pain, most likely rotator cuff related with less suspicion for labra tear. Patient demonstrates painful arc, pain/weakness with R shoulder resisted ER, positive hawkins-kennedy and popping/catching is not a chief compliant. He does appear to have slightly more laxity in the right GHJ compared to the left and pain in the posterior shoulder with anterior glide of GHJ while in maximum ER may suggest internal impingement. Positive O'brien's test is not corroborated well by other tests or  symptoms. Patient would most likely benefit from stabilization and strengthening program for the right shoulder girdle with interventions for pain control and improved muscle function a needed. Patient presents with significant pain, ROM, joint mobility, muscle performance (strength/power/endurance), motor control, and activity tolerance impairments that are limiting ability to complete usual activities such a sleeping, ADLS (brushing teeth, shaving), lifting things like a couch, throwing a foot ball (overhand throwing), anything that requires using the right UE a lot, especially overhead without difficulty. Patient will benefit from skilled physical therapy intervention to address current body structure impairments and activity limitations to improve function and work towards goals set in current POC in order to return to prior level of function or maximal functional improvement.   OBJECTIVE IMPAIRMENTS: decreased activity tolerance, decreased endurance, decreased knowledge of condition, decreased ROM, decreased strength, impaired perceived functional ability, increased muscle spasms, impaired UE functional use, and pain.   ACTIVITY LIMITATIONS: carrying, lifting, sleeping, reach over head, and hygiene/grooming  PARTICIPATION LIMITATIONS: community activity, occupation, school, and   difficulty with sleeping, ADLS (brushing teeth, shaving), lifting things like a couch, throwing a foot ball (overhand throwing), anything that requires using the right UE a lot, especially overhead  PERSONAL FACTORS: Past/current experiences and Time since onset of injury/illness/exacerbation are also affecting patient's functional outcome.   REHAB POTENTIAL: Good  CLINICAL DECISION MAKING: Stable/uncomplicated  EVALUATION COMPLEXITY: Low   GOALS: Goals reviewed with patient? No  SHORT TERM GOALS: Target date: 02/03/2023  Patient will be independent with initial home exercise program for self-management of  symptoms. Baseline: Initial HEP provided at IE (01/20/23); Goal status: In-progress   LONG TERM GOALS: Target date: 04/14/2023. Target date updated to 06/24/2022 for all unmet/new goals on 04/02/2023.   Patient will be independent with a long-term home exercise program for self-management of symptoms.  Baseline: Initial HEP provided at IE (01/20/23); participating well (03/17/2023); participating well (04/02/2023) Goal status: In-progress  2.  Patient will demonstrate improved FOTO to  equal or greater than 80 by visit #10 to demonstrate improvement in overall condition and self-reported functional ability.  Baseline: 62 (01/20/23); 61 at visit #5 (02/09/2023); 62 at visit #11 (04/02/2023) Goal status: In-progress  3.  Patient will demonstrate R shoulder strength equal or greater to L shoulder strength with no increase in pain to improve his ability to use R UE for daily tasks such as overhead work, throwing a football, and grooming tasks with less difficulty.  Baseline: R ER 4+/5 and painful (01/20/23); 5/5 B ER (04/02/23) Goal status: MET  4.  Patient will demonstrate ability to perform 20 consecutive overhead throws at the rebounder with 1kg med ball using R UE without increased pain to improve his ability to play sports, perform repetative overhead activities, and throw a football.  Baseline: reports throwing a football overhand is painful (01/20/23); Did the 20 throws with no pain, but "maybe slight soreness" (04/02/2023) Goal status: MET  5.  Patient will report being able to sleep on his right side without being awoken by right shoulder pain to improve his ability to sleep comfortably.  Baseline: awoken by pain whenever he rolls on the right side (01/20/23); still cannot sleep on that side (04/02/2023) Goal status: In-progress  6. Patient will do 10 bottoms-up 20# kettlebell OH presses with his right arm to improve his overhead shoulder stability and allow him to "trust his shoulder" when  lifting heavy objects and putting them on shelves  Baseline: He did 5 in session with 10# (04/02/2023)  Goal status: In-progress  7. Patient will demonstrate ability to perform 1 minute of consecutive overhead throws at the rebounder with 3kg med ball using R UE without increased pain to improve his ability to play sports, perform repetative overhead activities, and throw a football.   Baseline: He did 20 consecutive throws with 1kg medball and no pain  Goal status: In-progress    PLAN:  PT FREQUENCY: 1-2x/week  PT DURATION: 12 weeks  PLANNED INTERVENTIONS: Therapeutic exercises, Therapeutic activity, Neuromuscular re-education, Patient/Family education, Self Care, Joint mobilization, Dry Needling, Electrical stimulation, Spinal mobilization, Cryotherapy, Moist heat, Manual therapy, and Re-evaluation  PLAN FOR NEXT SESSION: update HEP as appropriate, treat for right latissimus dorsi tendinopathy. Consider dry needling to lat dorsi and/or teres major   Curtina Grills [place], SPT [employer] DPTE  Luretha Murphy. Ilsa Iha, PT, DPT 06/16/23, 2:13 PM  [facility] [address] [place], [place] [address] P: [phone] I F: [phone]

## 2023-06-18 ENCOUNTER — Ambulatory Visit: Payer: BC Managed Care – PPO | Admitting: Physical Therapy

## 2023-06-22 ENCOUNTER — Ambulatory Visit: Payer: BC Managed Care – PPO | Admitting: Physical Therapy

## 2023-06-22 ENCOUNTER — Encounter: Payer: Self-pay | Admitting: Physical Therapy

## 2023-06-22 DIAGNOSIS — G8929 Other chronic pain: Secondary | ICD-10-CM

## 2023-06-22 DIAGNOSIS — M25511 Pain in right shoulder: Secondary | ICD-10-CM | POA: Diagnosis not present

## 2023-06-22 NOTE — Therapy (Unsigned)
 OUTPATIENT PHYSICAL THERAPY PROGRESS NOTE / TREATMENT / RE-CERTIFICATION Reporting period from 04/02/23 to 06/22/23  Patient Name: Carlos Lloyd MRN: [id number] DOB:2000/04/29, 23 y.o., male Today's Date: 06/22/2023  END OF SESSION:  PT End of Session - 06/22/23 1910     Visit Number 18    Number of Visits 20    Date for PT Re-Evaluation 06/25/23    Authorization Type [employer] COMM PPO reporting period from 03/17/2023    PT Start Time 1731    PT Stop Time 1816    PT Time Calculation (min) 45 min    Activity Tolerance Patient tolerated treatment well    Behavior During Therapy [facility] for tasks assessed/performed                History reviewed. No pertinent past medical history. History reviewed. No pertinent surgical history. There are no active problems to display for this patient.   PCP: Eden Lathe, MD  REFERRING PROVIDER: Noralee Stain, MD   REFERRING DIAG: rotator cuff syndrome of right shoulder  THERAPY DIAG:  Chronic right shoulder pain  Rationale for Evaluation and Treatment: Rehabilitation  ONSET DATE: approx 1 year prior to PT evaluation  PERTINENT HISTORY: Patient is a 23 y.o. male who presents to outpatient physical therapy with a referral for medical diagnosis rotator cuff syndrome of right shoulder. This patient's chief complaints consist of chronic right shoulder pain leading to the following functional deficits: difficulty with sleeping, ADLS (brushing teeth, shaving), lifting things like a couch, throwing a foot ball (overhand throwing), anything that requires using the right UE a lot, especially overhead. Relevant past medical history and comorbidities include none reported.  Patient denies hx of cancer, stroke, seizures, lung problems, heart problems, diabetes, unexplained weight loss, unexplained changes in bowel or bladder problems, unexplained stumbling or dropping things, osteoporosis, and spinal surgery  SUBJECTIVE:                                                                                                                                                                                       SUBJECTIVE STATEMENT:  Patient arrives to session for progress note/re-certification to assess progress since last progress note on 04/02/23.   Feeling good today  Feeling a little bit of pain in right posterior shoulder  Felt some soreness after last session - soreness lasted 1-2 days  Some soreness from class today but not as sore as previous times Still avoids sleeping on right side  HEP has been going well - "no complaints"  Typically does exercises 3x per week  Definitely feels like he is improving overall - noticed the most difference after targeting the lats for  treatment  Plays basketball occasionally - can sometimes be bothersome    PAIN:  NPRS: 2/10 posterior right shoulder  PATIENT GOALS: "to be able to sleep on that side again" "be able to do simple tasks without pain" " and "be able to reach up in the air with my arm for a while without pain"  NEXT MD VISIT:   OBJECTIVE  Calculated 1RM (based off of <10RM tested in clinic): Lat pull down: 117.2 # (last tested 05/19/2023)  Bent over single arm row at cable machine (bracing with contralateral arm on seat); 63# (last measured 05/28/2023);   SELF-REPORTED FUNCTION FOTO score: 63/100 (shoulder questionnaire)   FUNCTIONAL TESTS Standing R OH Press with KB (bottom side up): 1x15 each side with 20# KB   OH Throw at Rebounder with 3kg medball with R UE  1 min  Some discomfort during eccentric portion of movement (catching ball and shoulder moving into ER through bringing shoulder back into IR)   Sidelying on Right side to mimic how he would sleep on that side (right hand under pillow, shoulder flexion to ~90 degrees)  Started to feel discomfort within 30 seconds of laying on right side   TODAY'S TREATMENT:    Therapeutic exercise: to centralize symptoms and improve ROM,  strength, muscular endurance, and activity tolerance required for successful completion of functional activities.   Upper body ergometer level 10 to encourage joint nutrition, warm tissue, induce analgesic effect of aerobic exercise, improve muscular strength and endurance,  and prepare for remainder of session. 5 min total.   Standing OH Press with KB (bottom side up)  1x10 each side with 10# KB  1x15 each side with 20# KB   Hooklying single arm lat pull over to improve ability to perform pulling and throwing activities 3x15 each side with 20#KB (Slow tempo)  Sidelying on Right side to mimic how he would sleep on that side (right hand under pillow, shoulder flexion to ~90 degrees)  Started to feel discomfort within 30 seconds of laying on right side   Therapeutic activities: dynamic therapeutic activities incorporating MULTIPLE parameters or areas of the body designed to achieve improved functional performance.  OH Throw at [employer] with 3kg medball with R UE (to simulate throwing during sports)  1 min  Some discomfort during eccentric portion of movement (catching ball and shoulder moving into ER through bringing shoulder back into IR)   Lat pull to improve ability to perform pulling activities such as pull ups: 1x15 at 90# (~77% 1RM) 1 min rest  1x15 at 90# (~77% 1RM) 1 min rest 1x15 at 90# (~77% 1RM)  Bent over single arm row with low cable and contralateral UE braced on seat with yoga block increasing height to improve ability to pick things up and complete pulling activities: 2x15 each side at 45# (~71% 1RM) 1 min rest between sets  Pt required multimodal cuing for proper technique and to facilitate improved neuromuscular control, strength, range of motion, and functional ability resulting in improved performance and form.  PATIENT EDUCATION: Education details: Exercise purpose/form. Self management techniques. Dry needling Person educated: Patient Education method:  Explanation, Demonstration, and Handouts Education comprehension: verbalized understanding, returned demonstration, and needs further education  HOME EXERCISE PROGRAM: Access Code: [email or web address] URL: [email or web address] Date: 06/15/2023 Prepared by: Aryaan Persichetti [place]  Exercises - Face Pulls  - 2 sets - 15 reps - 15 lbs weight - Supine Chest Press with Dumbbells  - 3-4 x weekly - 2 sets - 10 reps - Prone  Angels  - 3 x weekly - 2 sets - 12 reps - Prone Shoulder Horizontal Abduction with Thumbs Up  - 3 x weekly - 2 sets - 10 reps - Prone Lower Trapezius Strengthening on Swiss Ball with Dumbbells  - 3 x weekly - 10 reps - Half-Kneeling Bottoms Up [employer]  - 3 sets - 8-12 reps - Lat Pull Down - Cable/Bar  - 3 x weekly - 2-3 sets - 15 reps - 35 lbs weight - Child's Pose with Sidebending  - 1 x daily - 1 sets - 10 reps - 5 seconds hold  HOME EXERCISE PROGRAM [[id number]] View at "[email or web address]" using code: [email or web address] Pull Over -  Repeat 15 Repetitions, Complete 1 Set, Perform 2 Times a Day  HOME EXERCISE PROGRAM [[email or web address]] View at "[email or web address]" using code: [email or web address] S/L Subscap/Lat STM with Foam Roller -   Bent Over Row  -  Repeat 20 Repetitions, Complete 3 Sets, Perform 3 Times a Week   ASSESSMENT:  CLINICAL IMPRESSION:   Patient has attended 18 skilled physical therapy treatment sessions this episode of care and has met 2/8 stated goals and overall continues to make progress towards the remaining originally stated goals. New goal added to improve eccentric loading to latissimus dorsi muscles for improved tolerance to pulling/throwing movements. The focus of current treatment sessions is to continue increasing range of motion of the UE and progress loading to the latissimus dorsi muscles as tolerated to improve strength and endurance. Patient demonstrates improvement in pain level, activity tolerance, strength, range of motion, neuromuscular control, and  understanding of self-management techniques. Patient continues to present with impairments related to strength, endurance, and eccentric control during overhead movements that are consistent with latissimus dorsi tendinopathy that are limiting his ability to complete overhead tasks involving throwing/catching, school tasks, and sleeping on his right side without difficulty and increased pain. Patient will benefit from skilled physical therapy intervention to address current body structure impairments and activity limitations to improve function and work towards goals set in current POC in order to return to prior level of function or maximal functional improvement.   From Initial PT evaluation 01/20/2023: Patient is a 23 y.o. male referred to outpatient physical therapy with a medical diagnosis of rotator cuff syndrome of right shoulder who presents with signs and symptoms most consistent with chronic right shoulder pain, most likely rotator cuff related with less suspicion for labra tear. Patient demonstrates painful arc, pain/weakness with R shoulder resisted ER, positive hawkins-kennedy and popping/catching is not a chief compliant. He does appear to have slightly more laxity in the right GHJ compared to the left and pain in the posterior shoulder with anterior glide of GHJ while in maximum ER may suggest internal impingement. Positive O'brien's test is not corroborated well by other tests or symptoms. Patient would most likely benefit from stabilization and strengthening program for the right shoulder girdle with interventions for pain control and improved muscle function a needed. Patient presents with significant pain, ROM, joint mobility, muscle performance (strength/power/endurance), motor control, and activity tolerance impairments that are limiting ability to complete usual activities such a sleeping, ADLS (brushing teeth, shaving), lifting things like a couch, throwing a foot ball (overhand throwing),  anything that requires using the right UE a lot, especially overhead without difficulty. Patient will benefit from skilled physical therapy intervention to address current body structure impairments and activity limitations to improve function and work towards goals set in current POC in order to return to prior level of  function or maximal functional improvement.   OBJECTIVE IMPAIRMENTS: decreased activity tolerance, decreased endurance, decreased knowledge of condition, decreased ROM, decreased strength, impaired perceived functional ability, increased muscle spasms, impaired UE functional use, and pain.   ACTIVITY LIMITATIONS: carrying, lifting, sleeping, reach over head, and hygiene/grooming  PARTICIPATION LIMITATIONS: community activity, occupation, school, and   difficulty with sleeping, ADLS (brushing teeth, shaving), lifting things like a couch, throwing a foot ball (overhand throwing), anything that requires using the right UE a lot, especially overhead  PERSONAL FACTORS: Past/current experiences and Time since onset of injury/illness/exacerbation are also affecting patient's functional outcome.   REHAB POTENTIAL: Good  CLINICAL DECISION MAKING: Stable/uncomplicated  EVALUATION COMPLEXITY: Low   GOALS: Goals reviewed with patient? No  SHORT TERM GOALS: Target date: 02/03/2023  Patient will be independent with initial home exercise program for self-management of symptoms. Baseline: Initial HEP provided at IE (01/20/23); participation in HEP 3x/week (06/22/23); Goal status: In-progress   LONG TERM GOALS: Target date: 04/14/2023. Target date updated to 06/24/2022 for all unmet/new goals on 04/02/2023. Updated to 09/19/2023 for all unmet and new goals on 06/22/2023.   Patient will be independent with a long-term home exercise program for self-management of symptoms.  Baseline: Initial HEP provided at IE (01/20/23); participating well (03/17/2023); participating well (04/02/2023);  participation in HEP 3x/week (06/22/23);  Goal status: In-progress  2.  Patient will demonstrate improved FOTO to equal or greater than 80 by visit #10 to demonstrate improvement in overall condition and self-reported functional ability.  Baseline: 62 (01/20/23); 61 at visit #5 (02/09/2023); 62 at visit #11 (04/02/2023); 63/100 at visit #18 (06/22/23); Goal status: In-progress  3.  Patient will demonstrate R shoulder strength equal or greater to L shoulder strength with no increase in pain to improve his ability to use R UE for daily tasks such as overhead work, throwing a football, and grooming tasks with less difficulty.  Baseline: R ER 4+/5 and painful (01/20/23); 5/5 B ER (04/02/23) Goal status: MET  4.  Patient will demonstrate ability to perform 20 consecutive overhead throws at the rebounder with 1kg med ball using R UE without increased pain to improve his ability to play sports, perform repetative overhead activities, and throw a football.  Baseline: reports throwing a football overhand is painful (01/20/23); Did the 20 throws with no pain, but "maybe slight soreness" (04/02/2023) Goal status: MET  5.  Patient will report being able to sleep on his right side without being awoken by right shoulder pain to improve his ability to sleep comfortably.  Baseline: awoken by pain whenever he rolls on the right side (01/20/23); still cannot sleep on that side (04/02/2023); started to feel increased pain sidelying on his right side for 30 seconds (06/22/23);  Goal status: In-progress  6. Patient will do 10 bottoms-up 20# kettlebell OH presses with his right arm to improve his overhead shoulder stability and allow him to "trust his shoulder" when lifting heavy objects and putting them on shelves  Baseline: He did 5 in session with 10# (04/02/2023); completed 1x15 bottoms-up OH presses with 20# KB with no increased pain (06/22/23);  Goal status: MET  7. Patient will demonstrate ability to perform 1 minute  of consecutive overhead throws at the rebounder with 3kg med ball using R UE without increased pain to improve his ability to play sports, perform repetative overhead activities, and throw a football.   Baseline: He did 20 consecutive throws with 1kg medball and no pain; able to throw for 1 minute of  consecutive overhead throws, increased pain with catching part of motion into shoulder ER (06/22/23);  Goal status: In-progress  8. Patient will demonstrate ability to perform 20 negative pull ups without increased pain to improve eccentric loading tolerance of latissimus dorsi muscles to complete pulling and throwing activities with less difficulty.  Baseline: Able to complete 10 negative pull ups (06/15/23);  Goal status: NEW    PLAN:  PT FREQUENCY: 1-2x/week  PT DURATION: 12 weeks  PLANNED INTERVENTIONS: Therapeutic exercises, Therapeutic activity, Neuromuscular re-education, Patient/Family education, Self Care, Joint mobilization, Dry Needling, Electrical stimulation, Spinal mobilization, Cryotherapy, Moist heat, Manual therapy, and Re-evaluation  PLAN FOR NEXT SESSION: update HEP as appropriate, treat for right latissimus dorsi tendinopathy. Consider dry needling to lat dorsi and/or teres major. Eccentric loading to lat tendons.    Diamone Whistler [place], SPT [employer] DPTE  Huntley Dec R. Ilsa Iha, PT, DPT 06/22/23, 7:10 PM  [facility] [address] [place], [place] [address] P: [phone] I F: [phone]

## 2023-06-23 ENCOUNTER — Encounter: Payer: Self-pay | Admitting: Physical Therapy

## 2023-06-24 ENCOUNTER — Encounter: Payer: BC Managed Care – PPO | Admitting: Physical Therapy

## 2023-06-29 ENCOUNTER — Encounter: Payer: Self-pay | Admitting: Physical Therapy

## 2023-06-29 ENCOUNTER — Ambulatory Visit: Payer: BC Managed Care – PPO | Attending: Family Medicine | Admitting: Physical Therapy

## 2023-06-29 DIAGNOSIS — M25511 Pain in right shoulder: Secondary | ICD-10-CM | POA: Diagnosis present

## 2023-06-29 DIAGNOSIS — G8929 Other chronic pain: Secondary | ICD-10-CM | POA: Insufficient documentation

## 2023-06-29 NOTE — Therapy (Addendum)
 OUTPATIENT PHYSICAL THERAPY TREATMENT  Patient Name: Carlos Lloyd MRN: [id number] DOB:22-Oct-2000, 23 y.o., male Today's Date: 06/29/2023  END OF SESSION:  PT End of Session - 06/29/23 1759     Visit Number 19    Number of Visits 20    Date for PT Re-Evaluation 06/25/23    Authorization Type [employer] COMM PPO reporting period from 03/17/2023    PT Start Time 1738    PT Stop Time 1818    PT Time Calculation (min) 40 min    Activity Tolerance Patient tolerated treatment well    Behavior During Therapy [facility] for tasks assessed/performed                 History reviewed. No pertinent past medical history. History reviewed. No pertinent surgical history. There are no active problems to display for this patient.   PCP: Eden Lathe, MD  REFERRING PROVIDER: Noralee Stain, MD   REFERRING DIAG: rotator cuff syndrome of right shoulder  THERAPY DIAG:  Chronic right shoulder pain  Rationale for Evaluation and Treatment: Rehabilitation  ONSET DATE: approx 1 year prior to PT evaluation  PERTINENT HISTORY: Patient is a 23 y.o. male who presents to outpatient physical therapy with a referral for medical diagnosis rotator cuff syndrome of right shoulder. This patient's chief complaints consist of chronic right shoulder pain leading to the following functional deficits: difficulty with sleeping, ADLS (brushing teeth, shaving), lifting things like a couch, throwing a foot ball (overhand throwing), anything that requires using the right UE a lot, especially overhead. Relevant past medical history and comorbidities include none reported.  Patient denies hx of cancer, stroke, seizures, lung problems, heart problems, diabetes, unexplained weight loss, unexplained changes in bowel or bladder problems, unexplained stumbling or dropping things, osteoporosis, and spinal surgery  SUBJECTIVE:                                                                                                                                                                                       SUBJECTIVE STATEMENT:  Patient states his pain is about the same but he has pain near his Rhomboids and his right levator scapula more than usual. He states his right shoulder was sore for about 1 day after last PT session. His HEP have been going well and he has not been doing nay throwing. He did not get  chance to do the eccentric pull ups this week because he was sick.    PAIN:  NPRS: 3/10 posterior right shoulder  PATIENT GOALS: "to be able to sleep on that side again" "be able to do simple tasks without pain" " and "be able to reach up  in the air with my arm for a while without pain"  NEXT MD VISIT:   OBJECTIVE  Calculated 1RM (based off of <10RM tested in clinic): Lat pull down: 117.2 # (last tested 05/19/2023)  Bent over single arm row at cable machine (bracing with contralateral arm on seat); 63# (last measured 05/28/2023);    TODAY'S TREATMENT:    Therapeutic exercise: to centralize symptoms and improve ROM, strength, muscular endurance, and activity tolerance required for successful completion of functional activities.   Upper body ergometer level 10 to encourage joint nutrition, warm tissue, induce analgesic effect of aerobic exercise, improve muscular strength and endurance,  and prepare for remainder of session. 5 min total.   Hooklying single arm lat pull over to improve ability to perform pulling and throwing activities 2x15 each side with 20#KB (Slow tempo)  Therapeutic activities: dynamic therapeutic activities incorporating MULTIPLE parameters or areas of the body designed to achieve improved functional performance.  Superset:  Eccentric pull up (jumping from stool) with >= 5 second lower 3x10 1 min rest after each set  OH Throw at Rebounder with 2kg medball with R UE (to simulate throwing during sports)  3x20  1 min rest after each set  Overhead carry with walking lunges 2x30 feet  with 40#KB in R UE 1 min rest between sets L shoulder fatigue most limiting.  Needed cuing to keep elbow extended   Manual therapy: to reduce pain and tissue tension, improve range of motion, neuromodulation, in order to promote improved ability to complete functional activities. PRONE CPA grade III C2 - T10 (no reproduction of symptoms).  STM to right periscapular muscles including rhomboids, levator scap, upper trap with sustained pressure at tender portions of levator scap.  STM to right infraspinatus with sustained pressure (tender)  Pt required multimodal cuing for proper technique and to facilitate improved neuromuscular control, strength, range of motion, and functional ability resulting in improved performance and form.  PATIENT EDUCATION: Education details: Exercise purpose/form. Self management techniques.  Person educated: Patient Education method: Explanation, Demonstration, and Handouts Education comprehension: verbalized understanding, returned demonstration, and needs further education  HOME EXERCISE PROGRAM: Access Code: [email or web address] URL: [email or web address] Date: 06/15/2023 Prepared by: Sydney [place]  Exercises - Face Pulls  - 2 sets - 15 reps - 15 lbs weight - Supine Chest Press with Dumbbells  - 3-4 x weekly - 2 sets - 10 reps - Prone Angels  - 3 x weekly - 2 sets - 12 reps - Prone Shoulder Horizontal Abduction with Thumbs Up  - 3 x weekly - 2 sets - 10 reps - Prone Lower Trapezius Strengthening on Swiss Ball with Dumbbells  - 3 x weekly - 10 reps - Half-Kneeling Bottoms Up [employer]  - 3 sets - 8-12 reps - Lat Pull Down - Cable/Bar  - 3 x weekly - 2-3 sets - 15 reps - 35 lbs weight - Child's Pose with Sidebending  - 1 x daily - 1 sets - 10 reps - 5 seconds hold  HOME EXERCISE PROGRAM [[id number]] View at [email or web address]" using code: [email or web address] Pull Over -  Repeat 15 Repetitions, Complete 1 Set, Perform 2 Times a Day  HOME EXERCISE  PROGRAM [[email or web address]] View at "[email or web address]" using code: [email or web address] S/L Subscap/Lat STM with Foam Roller -   Bent Over Row  -  Repeat 20 Repetitions, Complete 3 Sets, Perform 3 Times a Week   ASSESSMENT:  CLINICAL IMPRESSION:   Patient arrives with mildly increased and wider spread pain today after being  sick last week. He was tender in levator scap and posterior rotator cuff region today as well as the usual location in the lat. Session focused on progressing loading of the lat dorsi to include throwing and full body weight for slow heavy loading. Also added overhead carry for overall shoulder stability and tolerance for a position he previously avoided. Patient tolerated session well and fatigued appropriately. He reported decreased pain by end of session. He was encouraged to do eccentric pull ups at home. Patient would benefit from continued management of limiting condition by skilled physical therapist to address remaining impairments and functional limitations to work towards stated goals and return to PLOF or maximal functional independence.   From Initial PT evaluation 01/20/2023: Patient is a 23 y.o. male referred to outpatient physical therapy with a medical diagnosis of rotator cuff syndrome of right shoulder who presents with signs and symptoms most consistent with chronic right shoulder pain, most likely rotator cuff related with less suspicion for labra tear. Patient demonstrates painful arc, pain/weakness with R shoulder resisted ER, positive hawkins-kennedy and popping/catching is not a chief compliant. He does appear to have slightly more laxity in the right GHJ compared to the left and pain in the posterior shoulder with anterior glide of GHJ while in maximum ER may suggest internal impingement. Positive O'brien's test is not corroborated well by other tests or symptoms. Patient would most likely benefit from stabilization and strengthening program for the right shoulder girdle with  interventions for pain control and improved muscle function a needed. Patient presents with significant pain, ROM, joint mobility, muscle performance (strength/power/endurance), motor control, and activity tolerance impairments that are limiting ability to complete usual activities such a sleeping, ADLS (brushing teeth, shaving), lifting things like a couch, throwing a foot ball (overhand throwing), anything that requires using the right UE a lot, especially overhead without difficulty. Patient will benefit from skilled physical therapy intervention to address current body structure impairments and activity limitations to improve function and work towards goals set in current POC in order to return to prior level of function or maximal functional improvement.   OBJECTIVE IMPAIRMENTS: decreased activity tolerance, decreased endurance, decreased knowledge of condition, decreased ROM, decreased strength, impaired perceived functional ability, increased muscle spasms, impaired UE functional use, and pain.   ACTIVITY LIMITATIONS: carrying, lifting, sleeping, reach over head, and hygiene/grooming  PARTICIPATION LIMITATIONS: community activity, occupation, school, and   difficulty with sleeping, ADLS (brushing teeth, shaving), lifting things like a couch, throwing a foot ball (overhand throwing), anything that requires using the right UE a lot, especially overhead  PERSONAL FACTORS: Past/current experiences and Time since onset of injury/illness/exacerbation are also affecting patient's functional outcome.   REHAB POTENTIAL: Good  CLINICAL DECISION MAKING: Stable/uncomplicated  EVALUATION COMPLEXITY: Low   GOALS: Goals reviewed with patient? No  SHORT TERM GOALS: Target date: 02/03/2023  Patient will be independent with initial home exercise program for self-management of symptoms. Baseline: Initial HEP provided at IE (01/20/23); participation in HEP 3x/week (06/22/23); Goal status:  In-progress   LONG TERM GOALS: Target date: 04/14/2023. Target date updated to 06/24/2022 for all unmet/new goals on 04/02/2023. Updated to 09/19/2023 for all unmet and new goals on 06/22/2023.   Patient will be independent with a long-term home exercise program for self-management of symptoms.  Baseline: Initial HEP provided at IE (01/20/23); participating well (03/17/2023); participating well (04/02/2023); participation in HEP 3x/week (06/22/23);  Goal status: In-progress  2.  Patient will demonstrate improved FOTO to equal or greater than  80 by visit #10 to demonstrate improvement in overall condition and self-reported functional ability.  Baseline: 62 (01/20/23); 61 at visit #5 (02/09/2023); 62 at visit #11 (04/02/2023); 63/100 at visit #18 (06/22/23); Goal status: In-progress  3.  Patient will demonstrate R shoulder strength equal or greater to L shoulder strength with no increase in pain to improve his ability to use R UE for daily tasks such as overhead work, throwing a football, and grooming tasks with less difficulty.  Baseline: R ER 4+/5 and painful (01/20/23); 5/5 B ER (04/02/23) Goal status: MET  4.  Patient will demonstrate ability to perform 20 consecutive overhead throws at the rebounder with 1kg med ball using R UE without increased pain to improve his ability to play sports, perform repetative overhead activities, and throw a football.  Baseline: reports throwing a football overhand is painful (01/20/23); Did the 20 throws with no pain, but "maybe slight soreness" (04/02/2023) Goal status: MET  5.  Patient will report being able to sleep on his right side without being awoken by right shoulder pain to improve his ability to sleep comfortably.  Baseline: awoken by pain whenever he rolls on the right side (01/20/23); still cannot sleep on that side (04/02/2023); started to feel increased pain sidelying on his right side for 30 seconds (06/22/23);  Goal status: In-progress  6. Patient will  do 10 bottoms-up 20# kettlebell OH presses with his right arm to improve his overhead shoulder stability and allow him to "trust his shoulder" when lifting heavy objects and putting them on shelves  Baseline: He did 5 in session with 10# (04/02/2023); completed 1x15 bottoms-up OH presses with 20# KB with no increased pain (06/22/23);  Goal status: MET  7. Patient will demonstrate ability to perform 1 minute of consecutive overhead throws at the rebounder with 3kg med ball using R UE without increased pain to improve his ability to play sports, perform repetative overhead activities, and throw a football.   Baseline: He did 20 consecutive throws with 1kg medball and no pain; able to throw for 1 minute of consecutive overhead throws, increased pain with catching part of motion into shoulder ER (06/22/23);  Goal status: In-progress  8. Patient will demonstrate ability to perform 20 negative pull ups without increased pain to improve eccentric loading tolerance of latissimus dorsi muscles to complete pulling and throwing activities with less difficulty.  Baseline: Able to complete 10 negative pull ups (06/15/23);  Goal status: NEW    PLAN:  PT FREQUENCY: 1-2x/week  PT DURATION: 12 weeks  PLANNED INTERVENTIONS: Therapeutic exercises, Therapeutic activity, Neuromuscular re-education, Patient/Family education, Self Care, Joint mobilization, Dry Needling, Electrical stimulation, Spinal mobilization, Cryotherapy, Moist heat, Manual therapy, and Re-evaluation  PLAN FOR NEXT SESSION: update HEP as appropriate, treat for right latissimus dorsi tendinopathy. Consider dry needling to lat dorsi and/or teres major. Eccentric loading to lat tendons.   Luretha Murphy. Ilsa Iha, PT, DPT 06/29/23, 8:02 PM  [facility] [address] [place], [place] [address] P: [phone] I F: [phone]

## 2023-07-01 ENCOUNTER — Encounter: Payer: BC Managed Care – PPO | Admitting: Physical Therapy

## 2023-07-21 ENCOUNTER — Encounter: Payer: Self-pay | Admitting: Physical Therapy

## 2023-07-27 ENCOUNTER — Encounter: Payer: Self-pay | Admitting: Physical Therapy

## 2023-07-27 ENCOUNTER — Ambulatory Visit: Admitting: Physical Therapy

## 2023-07-27 DIAGNOSIS — M25511 Pain in right shoulder: Secondary | ICD-10-CM | POA: Diagnosis not present

## 2023-07-27 DIAGNOSIS — G8929 Other chronic pain: Secondary | ICD-10-CM

## 2023-07-27 NOTE — Therapy (Signed)
 OUTPATIENT PHYSICAL THERAPY DISCHARGE SUMMARY  Reporting period from 01/20/2024 to 07/27/2023  Patient Name: Carlos Lloyd MRN: [id number] DOB:[birth year], 23 y.o., male Today's Date: 07/27/2023  END OF SESSION:  PT End of Session - 07/27/23 1907     Visit Number 20    Number of Visits 20    Date for PT Re-Evaluation 07/27/23    Authorization Type [employer] COMM PPO reporting period from 03/17/2023    PT Start Time 1902    PT Stop Time 1927    PT Time Calculation (min) 25 min    Activity Tolerance Patient tolerated treatment well    Behavior During Therapy [facility] for tasks assessed/performed              History reviewed. No pertinent past medical history. History reviewed. No pertinent surgical history. There are no active problems to display for this patient.   PCP: Eden Lathe, MD  REFERRING PROVIDER: Noralee Stain, MD   REFERRING DIAG: rotator cuff syndrome of right shoulder  THERAPY DIAG:  Chronic right shoulder pain  Rationale for Evaluation and Treatment: Rehabilitation  ONSET DATE: approx 1 year prior to PT evaluation  PERTINENT HISTORY: Patient is a 23 y.o. male who presents to outpatient physical therapy with a referral for medical diagnosis rotator cuff syndrome of right shoulder. This patient's chief complaints consist of chronic right shoulder pain leading to the following functional deficits: difficulty with sleeping, ADLS (brushing teeth, shaving), lifting things like a couch, throwing a foot ball (overhand throwing), anything that requires using the right UE a lot, especially overhead. Relevant past medical history and comorbidities include none reported.  Patient denies hx of cancer, stroke, seizures, lung problems, heart problems, diabetes, unexplained weight loss, unexplained changes in bowel or bladder problems, unexplained stumbling or dropping things, osteoporosis, and spinal surgery  SUBJECTIVE:                                                                                                                                                                                       SUBJECTIVE STATEMENT:  Patient states he is doing well. His R shoulder is about the same. He has been doing his HEP consistently. He states he thinks it has not been as bad when he is writing. He reports his shoulder improved significantly from baseline at initial evaluation, but he still has pain with daily tasks such as writing in class and sleeping.    PAIN:  NPRS: 1/10 posterior right shoulder  PATIENT GOALS: "to be able to sleep on that side again" "be able to do simple tasks without pain" " and "be able to reach up in the air with my  arm for a while without pain"  NEXT MD VISIT:   OBJECTIVE  SELF-REPORTED FUNCTION FOTO score: 63/100 (shoulder questionnaire)    FUNCTIONAL TESTS Eccentric pull up max reps: 1x20 with pain up to 3/10   OH Throw at Rebounder with 3kg medball with R UE            1 min: able to perform with 3/10 pain  Most discomfort during eccentric to concentric transition in full cocked back position movement (catching ball and shoulder moving into ER through bringing shoulder back into IR)    Sidelying on Right side to mimic how he would sleep on that side (right hand under head, shoulder flexion to ~90 degrees)  Started to feel discomfort within 42 seconds of laying on right side   TODAY'S TREATMENT:    Therapeutic exercise: to centralize symptoms and improve ROM, strength, muscular endurance, and activity tolerance required for successful completion of functional activities.   Upper body ergometer level 10 to encourage joint nutrition, warm tissue, induce analgesic effect of aerobic exercise, improve muscular strength and endurance,  and prepare for remainder of session. 5 min changing directions every 1 min.   Therapeutic activities: dynamic therapeutic activities incorporating MULTIPLE parameters or areas of the body designed to achieve  improved functional performance.  Seated Lat Pulls to warm up pull up movement 2x10 at 45#  Eccentric pull up (jumping from stool) 1x20 2-3/10 concordant pain  OH Throw at [employer] with 3kg medball with R UE (to simulate throwing during sports)  Self-selected set of throwing to warm up 1x1 min continuous throwing   PATIENT EDUCATION: Education details: POC, progress, discharge recommendations.  Person educated: Patient Education method: Explanation, Demonstration, and Handouts Education comprehension: verbalized understanding, returned demonstration, and needs further education  HOME EXERCISE PROGRAM: Access Code: [email or web address] URL: [email or web address] Date: 06/15/2023 Prepared by: Sydney [place]  Exercises - Face Pulls  - 2 sets - 15 reps - 15 lbs weight - Supine Chest Press with Dumbbells  - 3-4 x weekly - 2 sets - 10 reps - Prone Angels  - 3 x weekly - 2 sets - 12 reps - Prone Shoulder Horizontal Abduction with Thumbs Up  - 3 x weekly - 2 sets - 10 reps - Prone Lower Trapezius Strengthening on Swiss Ball with Dumbbells  - 3 x weekly - 10 reps - Half-Kneeling Bottoms Up [employer]  - 3 sets - 8-12 reps - Lat Pull Down - Cable/Bar  - 3 x weekly - 2-3 sets - 15 reps - 35 lbs weight - Child's Pose with Sidebending  - 1 x daily - 1 sets - 10 reps - 5 seconds hold  HOME EXERCISE PROGRAM [[email or web address]] View at "[email or web address]" using code: [email or web address] Pull Over -  Repeat 15 Repetitions, Complete 1 Set, Perform 2 Times a Day  HOME EXERCISE PROGRAM [[email or web address]] View at "[email or web address]" using code: [email or web address] S/L Subscap/Lat STM with Foam Roller -   Bent Over Row  -  Repeat 20 Repetitions, Complete 3 Sets, Perform 3 Times a Week   ASSESSMENT:  CLINICAL IMPRESSION:   Patient has attended 20 physical therapy sessions since starting current episode of care. He has met 1/1 short term goal, 3/5 original long term goals and 4/8 total long term goals. He did  not make significant progress towards FOTO goal, but he made some progress towards his sleeping goal, overhead throwing goals, and pull up goals. 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 Patient was originally treated for rotator cuff related pain with likely need for improved active stabilization of the joint. 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 Patient is now being discharged to a long term HEP due to plateau in progress with further PT. Patient is recommended to follow up with medical doctor/orthopedist as desired for further medical evaluation. Patient would benefit from continued management of limiting condition by skilled physical therapist to address remaining impairments and functional limitations to work towards stated goals and return to PLOF or maximal functional independence.   OBJECTIVE IMPAIRMENTS: decreased activity tolerance, decreased endurance, decreased knowledge of condition, decreased ROM, decreased strength, impaired perceived functional ability, increased muscle spasms, impaired UE functional use, and pain.   ACTIVITY LIMITATIONS: carrying, lifting, sleeping, reach over head, and hygiene/grooming  PARTICIPATION LIMITATIONS: community activity, occupation, school, and   difficulty with sleeping, ADLS (brushing teeth, shaving), lifting things like a couch, throwing a foot ball (overhand throwing), anything that requires using the right UE a lot, especially overhead  PERSONAL FACTORS: Past/current experiences and Time since onset of injury/illness/exacerbation are also affecting patient's functional outcome.   REHAB POTENTIAL: Good  CLINICAL DECISION MAKING: Stable/uncomplicated  EVALUATION COMPLEXITY:  Low   GOALS: Goals reviewed with patient? No  SHORT TERM GOALS: Target date: 02/03/2023  Patient will be independent with initial home exercise program for self-management of symptoms. Baseline: Initial HEP provided at IE (01/20/23); participation in HEP 3x/week (06/22/23); Goal status: MET   LONG TERM GOALS: Target date: 04/14/2023. Target date updated to 06/24/2022 for all unmet/new goals on 04/02/2023. Updated to 09/19/2023 for all unmet and new goals on 06/22/2023.   Patient will be independent with a long-term home exercise program for self-management of symptoms.  Baseline: Initial HEP provided at IE (01/20/23); participating well (03/17/2023); participating well (04/02/2023); participation in HEP 3x/week (06/22/23);  Goal status: MET  2.  Patient will demonstrate improved FOTO to equal or greater than 80 by visit #10 to demonstrate improvement in overall condition and self-reported functional ability.  Baseline: 62 (01/20/23); 61 at visit #5 (02/09/2023); 62 at visit #11 (04/02/2023); 63/100 at visit #18 (06/22/23); 63/100 at visit #20 (07/27/2023);  Goal status: NOT MET  3.  Patient will demonstrate R shoulder strength equal or greater to L shoulder strength with no increase in pain to improve his ability to use R UE for daily tasks such as overhead work, throwing a football, and grooming tasks with less difficulty.  Baseline: R ER 4+/5 and painful (01/20/23); 5/5 B ER (04/02/23) Goal status: MET  4.  Patient will demonstrate ability to perform 20 consecutive overhead throws at the rebounder with 1kg med ball using R UE without increased pain to improve his ability to play sports, perform repetative overhead activities, and throw a football.  Baseline: reports throwing a football overhand is painful (01/20/23); Did the 20 throws with no pain, but "maybe slight soreness" (04/02/2023) Goal status: MET  5.  Patient will report being able to sleep on his right side without being awoken by right  shoulder pain to improve his ability to sleep comfortably.  Baseline: awoken by pain whenever he rolls on the right side (01/20/23); still cannot sleep on that side (04/02/2023); started to feel increased pain sidelying on his right side for 30 seconds (06/22/23); hard to  fall back asleep if he gets woken up (07/27/2023);  Goal status: Not MET  6. Patient will do 10 bottoms-up 20# kettlebell OH presses with his right arm to improve his overhead shoulder stability and allow him to "trust his shoulder" when lifting heavy objects and putting them on shelves  Baseline: He did 5 in session with 10# (04/02/2023); completed 1x15 bottoms-up OH presses with 20# KB with no increased pain (06/22/23);  Goal status: MET  7. Patient will demonstrate ability to perform 1 minute of consecutive overhead throws at the rebounder with 3kg med ball using R UE without increased pain to improve his ability to play sports, perform repetative overhead activities, and throw a football.   Baseline: He did 20 consecutive throws with 1kg medball and no pain; able to throw for 1 minute of consecutive overhead throws, increased pain with catching part of motion into shoulder ER (06/22/23); able to perform with 3/10 pain, most discomfort during eccentric to concentric transition in full cocked back position movement (catching ball and shoulder moving into ER through bringing shoulder back into IR) (07/27/2023);   Goal status: NOT MET  8. Patient will demonstrate ability to perform 20 negative pull ups without increased pain to improve eccentric loading tolerance of latissimus dorsi muscles to complete pulling and throwing activities with less difficulty. Baseline: Able to complete 10 negative pull ups (06/15/23); 1x20 negative pull ups with 2-3/10 concordant pain (07/27/2023);   Goal status: Partially MET    PLAN:  PT FREQUENCY: 1-2x/week  PT DURATION: 12 weeks  PLANNED INTERVENTIONS: Therapeutic exercises, Therapeutic activity,  Neuromuscular re-education, Patient/Family education, Self Care, Joint mobilization, Dry Needling, Electrical stimulation, Spinal mobilization, Cryotherapy, Moist heat, Manual therapy, and Re-evaluation  PLAN FOR NEXT SESSION: patient is now discharged from PT   Keland Peyton R. Ilsa Iha, PT, DPT 07/27/23, 7:45 PM  [facility] [address] [place], [place] [address] P: [phone] I F: [phone]

## 2023-08-25 ENCOUNTER — Encounter: Payer: Self-pay | Admitting: Physical Therapy

## 2023-09-23 ENCOUNTER — Ambulatory Visit: Admitting: Orthopaedic Surgery

## 2023-09-28 ENCOUNTER — Ambulatory Visit (INDEPENDENT_AMBULATORY_CARE_PROVIDER_SITE_OTHER): Admitting: Sports Medicine

## 2023-09-28 ENCOUNTER — Other Ambulatory Visit (INDEPENDENT_AMBULATORY_CARE_PROVIDER_SITE_OTHER)

## 2023-09-28 DIAGNOSIS — G8929 Other chronic pain: Secondary | ICD-10-CM

## 2023-09-28 DIAGNOSIS — M25511 Pain in right shoulder: Secondary | ICD-10-CM | POA: Diagnosis not present

## 2023-09-28 DIAGNOSIS — M25311 Other instability, right shoulder: Secondary | ICD-10-CM

## 2023-09-28 NOTE — Progress Notes (Signed)
 Carlos Lloyd - 23 y.o. male MRN [id number]  Date of birth: 02-20-2001  Office Visit Note: Visit Date: 09/28/2023 PCP: Nestor Banter, MD Referred by: Shauna Del, DO  Subjective: Chief Complaint  Patient presents with   Right Shoulder - Pain   HPI: Carlos Lloyd is a pleasant 23 y.o. male who presents today for chronic right shoulder pain. He is RHD.  Carlos Lloyd has had right shoulder pain for about 2 years now.  This developed insidiously over time, but he does remember a moment in the gym where he was performing a dumbbell row and felt a pain within the shoulder and posterior shoulder.  He never had a pop or inciting event otherwise.  He has pain with certain reaching motions, external rotation as well as putting pressure on the elbow.  He has pain with sleeping and is unable to lay on that side at night.  He is very active playing sports and doing other physical activity.  He is currently in school for physical therapy.  He has been doing formalized PT for about 5 months with only minimal improvement.  Has tried ice and ibuprofen  as needed with minimal relief as well.  Pertinent ROS were reviewed with the patient and found to be negative unless otherwise specified above in HPI.   Assessment & Plan: Visit Diagnoses:  1. Chronic right shoulder pain   2. Shoulder instability, right    Plan: Impression is chronic right shoulder pain for almost 2 years now which has only had mild improvement with over 5 months of physical therapy, ice and anti-inflammatories.  X-rays show a shallow acetabular socket and his exam suggest possible irritation of the underlying labrum. He does have about 8 degrees more of external rotation as well as + apprehension test of the right shoulder.  At this point, we discussed moving forward with an MRI arthrogram of the shoulder to evaluate the labrum and internal pathology of the right shoulder.  I will follow-up with him 1 week after this to review and discuss next  steps.  Follow-up: Return for 1 week after MRI to review and discuss next steps.   Meds & Orders: No orders of the defined types were placed in this encounter.   Orders Placed This Encounter  Procedures   XR Shoulder Right   MR SHOULDER RIGHT W CONTRAST   Arthrogram     Procedures: No procedures performed      Clinical History: No specialty comments available.  He reports that he has never smoked. He has never used smokeless tobacco. No results for input(s): "HGBA1C", "LABURIC" in the last 8760 hours.  Objective:    Physical Exam  Gen: Well-appearing, in no acute distress; non-toxic CV: Well-perfused. Warm.  Resp: Breathing unlabored on room air; no wheezing. Psych: Fluid speech in conversation; appropriate affect; normal thought process  Ortho Exam - Right shoulder: No AC joint TTP.  No redness swelling or effusion.  There is full active and passive range of motion in all directions.  There is mild pain with resisted abduction as well as external rotation.  Positive O'Brien's testing.  There is about 8-10 degrees more of external rotation at the 90/90 position of the right arm compared to the left. + Apprehension on the right compared to the left shoulder which is negative. I cannot appreciate any anterior/posterior increased translation or laxity.  Imaging: XR Shoulder Right Result Date: 09/28/2023 4 views of the right shoulder including AP, Grashey, scapular Y and axial view were ordered  and reviewed by myself today.  X-rays demonstrate humeral head which is well located within the glenohumeral joint.  There is a somewhat shallow acetabular socket.  Otherwise no acute fracture or significant bony abnormality noted.  Past Medical/Family/Surgical/Social History: Medications & Allergies reviewed per EMR, new medications updated. There are no active problems to display for this patient.  No past medical history on file. No family history on file. No past surgical history on  file. Social History   Occupational History   Not on file  Tobacco Use   Smoking status: Never   Smokeless tobacco: Never  Vaping Use   Vaping status: Never Used  Substance and Sexual Activity   Alcohol use: Never   Drug use: Never   Sexual activity: Not on file

## 2023-09-28 NOTE — Progress Notes (Signed)
 Patient says that he has had right shoulder pain for about 2 years now. He does remember around that time having pain with a dumbbell row; he does not remember feeling a pop or a pull. He says that since then, he has had pain when playing sports and doing other activities. He has no pain with shoulder flexion, abduction, or horizontal adduction; he does have pain with external rotation, and anytime he puts pressure on his elbow. He has had about 5 months of physical therapy and says that his pain has improved some, but pain with ROM and sports does not seem to have gotten much better. He is right handed. He has tried ice with some relief, as well as Iburprofen only as needed.

## 2023-10-01 ENCOUNTER — Encounter: Payer: Self-pay | Admitting: Sports Medicine

## 2023-10-15 ENCOUNTER — Ambulatory Visit
Admission: RE | Admit: 2023-10-15 | Discharge: 2023-10-15 | Disposition: A | Discharge status: Home / Self Care | Source: Ambulatory Visit | Attending: Sports Medicine

## 2023-10-15 ENCOUNTER — Ambulatory Visit
Admission: RE | Admit: 2023-10-15 | Discharge: 2023-10-15 | Disposition: A | Discharge status: Home / Self Care | Source: Ambulatory Visit | Attending: Sports Medicine | Admitting: Sports Medicine

## 2023-10-15 DIAGNOSIS — M25311 Other instability, right shoulder: Secondary | ICD-10-CM

## 2023-10-15 DIAGNOSIS — G8929 Other chronic pain: Secondary | ICD-10-CM

## 2023-10-15 MED ORDER — IOPAMIDOL (ISOVUE-M 200) INJECTION 41%
10.0000 mL | Freq: Once | INTRAMUSCULAR | Status: AC
  Administered 2023-10-15: 10 mL via INTRA_ARTICULAR

## 2023-10-20 ENCOUNTER — Ambulatory Visit: Payer: Self-pay | Admitting: Sports Medicine

## 2023-10-24 ENCOUNTER — Encounter: Payer: Self-pay | Admitting: Sports Medicine

## 2023-10-27 NOTE — Telephone Encounter (Signed)
 Appt made. PT aware.

## 2023-11-02 ENCOUNTER — Ambulatory Visit (INDEPENDENT_AMBULATORY_CARE_PROVIDER_SITE_OTHER): Admitting: Sports Medicine

## 2023-11-02 DIAGNOSIS — M67911 Unspecified disorder of synovium and tendon, right shoulder: Secondary | ICD-10-CM

## 2023-11-02 DIAGNOSIS — M25311 Other instability, right shoulder: Secondary | ICD-10-CM | POA: Diagnosis not present

## 2023-11-02 DIAGNOSIS — M25511 Pain in right shoulder: Secondary | ICD-10-CM

## 2023-11-02 DIAGNOSIS — G8929 Other chronic pain: Secondary | ICD-10-CM

## 2023-11-02 NOTE — Progress Notes (Unsigned)
 Patient is here for MRI review. He denies any new symptoms with his shoulder.

## 2023-11-02 NOTE — Progress Notes (Unsigned)
   Carlos Lloyd - 23 y.o. male MRN [id number]  Date of birth: 31-Dec-2000  Office Visit Note: Visit Date: 11/02/2023 PCP: Diedra Lame, MD Referred by: Diedra Lame, MD  Subjective: Chief Complaint  Patient presents with  . Right Shoulder - Follow-up   HPI: Carlos Lloyd is a pleasant 23 y.o. male who presents today for ***  Pertinent ROS were reviewed with the patient and found to be negative unless otherwise specified above in HPI.   Assessment & Plan: Visit Diagnoses: No diagnosis found.  Plan: ***  Follow-up: No follow-ups on file.   Meds & Orders: No orders of the defined types were placed in this encounter.  No orders of the defined types were placed in this encounter.    Procedures: No procedures performed      Clinical History: No specialty comments available.  He reports that he has never smoked. He has never used smokeless tobacco. No results for input(s): HGBA1C, LABURIC in the last 8760 hours.  Objective:   Vital Signs: There were no vitals taken for this visit.  Physical Exam  Gen: Well-appearing, in no acute distress; non-toxic CV: Well-perfused. Warm.  Resp: Breathing unlabored on room air; no wheezing. Psych: Fluid speech in conversation; appropriate affect; normal thought process  Ortho Exam - ***  Imaging: No results found.  Past Medical/Family/Surgical/Social History: Medications & Allergies reviewed per EMR, new medications updated. There are no active problems to display for this patient.  No past medical history on file. No family history on file. No past surgical history on file. Social History   Occupational History  . Not on file  Tobacco Use  . Smoking status: Never  . Smokeless tobacco: Never  Vaping Use  . Vaping status: Never Used  Substance and Sexual Activity  . Alcohol use: Never  . Drug use: Never  . Sexual activity: Not on file

## 2023-11-03 ENCOUNTER — Encounter: Payer: Self-pay | Admitting: Sports Medicine

## 2023-11-15 ENCOUNTER — Encounter: Payer: Self-pay | Admitting: Sports Medicine

## 2023-12-18 ENCOUNTER — Ambulatory Visit (INDEPENDENT_AMBULATORY_CARE_PROVIDER_SITE_OTHER): Admitting: Sports Medicine

## 2023-12-18 ENCOUNTER — Other Ambulatory Visit: Payer: Self-pay

## 2023-12-18 DIAGNOSIS — M25311 Other instability, right shoulder: Secondary | ICD-10-CM

## 2023-12-18 DIAGNOSIS — G8929 Other chronic pain: Secondary | ICD-10-CM | POA: Diagnosis not present

## 2023-12-18 DIAGNOSIS — M25511 Pain in right shoulder: Secondary | ICD-10-CM | POA: Diagnosis not present

## 2023-12-18 MED ORDER — BUPIVACAINE HCL 0.25 % IJ SOLN
2.0000 mL | INTRAMUSCULAR | Status: AC | PRN
  Administered 2023-12-18: 2 mL via INTRA_ARTICULAR

## 2023-12-18 MED ORDER — LIDOCAINE HCL 1 % IJ SOLN
2.0000 mL | INTRAMUSCULAR | Status: AC | PRN
  Administered 2023-12-18: 2 mL

## 2023-12-18 MED ORDER — METHYLPREDNISOLONE ACETATE 40 MG/ML IJ SUSP
60.0000 mg | INTRAMUSCULAR | Status: AC | PRN
  Administered 2023-12-18: 60 mg via INTRA_ARTICULAR

## 2023-12-18 NOTE — Progress Notes (Signed)
 Office & Procedure Note  Patient: Carlos Lloyd             Date of Birth: 2001-04-11           MRN: [id number]             Visit Date: 12/18/2023  HPI: Aleksandr continues with some ongoing right shoulder pain.  Feels this more so when he is at his desk leaning on the right elbow with the shoulder in a fixed position.  Still some pain with endrange external rotation as well.  He is in PT school currently and continues doing his PT regimen on his own.  He would like to proceed with 1 time glenohumeral joint injection for diagnostic and therpaeutic purposes.  PE: No redness swelling or effusion of the right shoulder.  Previous examination does show about 8-10 degrees more of external rotation at the 90/90 position of the right arm compared to the left side.  Strength testing was not tested during the visit today.  Imaging:  MR SHOULDER RIGHT W CONTRAST EXAM DESCRIPTION: MR SHOULDER RIGHT W CONTRAST  CLINICAL HISTORY: 22 years, Male, MRI arthrogram, chronic right shoulder pain with labral pathology suspected.  COMPARISON: None  TECHNIQUE: MRI of the shoulder was performed with multiplanar multi sequence imaging according to our usual protocol.  FINDINGS: Mild insertional tendinopathy of the infraspinatus tendon without tear. The supraspinatus, subscapularis, and teres minor tendons are unremarkable. No loose bodies.  No labral tear. The biceps tendon is unremarkable. No significant degenerative change. No bursal fluid.  IMPRESSION: Mild insertional tendinopathy of the infraspinatus tendon without tear.  The exam is otherwise unremarkable.  Electronically signed by: Reyes Frees MD 10/15/2023 10:04 PM EDT RP Workstation: [id number] Arthrogram CLINICAL DATA:  Chronic right shoulder pain x2 years. Shoulder instability. Suspected labral pathology.  EXAM: RIGHT SHOULDER INJECTION UNDER FLUOROSCOPY  TECHNIQUE: An appropriate skin entrance site was determined. The site  was marked, prepped with Betadine, draped in the usual sterile fashion, and infiltrated locally with 1% lidocaine . A 22 gauge spinal needle was advanced to the superomedial margin of the humeral head under intermittent fluoroscopy. 1 mL of 1% lidocaine  injected easily. A mixture of 0.1 mL of MultiHance, 15 mL of Isovue -M 200, and 5 mL of sterile saline was then used to opacify the right shoulder joint. There was no immediate complication.  FLUOROSCOPY: Radiation Exposure Index (as provided by the fluoroscopic device): 2.10 mGy Kerma  IMPRESSION: Technically successful right shoulder injection for MRI.  Performed by: Wyatt Pommier, PA-C  Electronically Signed   By: Dasie Hamburg M.D.   On: 10/15/2023 15:42  Visit Diagnoses:  1. Chronic right shoulder pain   2. Shoulder instability, right    Procedures: Large Joint Inj: R glenohumeral on 12/18/2023 10:05 AM Indications: pain and diagnostic evaluation Details: 22 G 3.5 in needle, ultrasound-guided posterior approach Medications: 2 mL lidocaine  1 %; 2 mL bupivacaine  0.25 %; 60 mg methylPREDNISolone  acetate 40 MG/ML Outcome: tolerated well, no immediate complications  US -guided glenohumeral joint injection, right shoulder After discussion on risks/benefits/indications, informed verbal consent was obtained. A timeout was then performed. The patient was positioned lying lateral recumbent on examination table. The patient's shoulder was prepped with betadine and multiple alcohol swabs and utilizing ultrasound guidance, the patient's glenohumeral joint was identified on ultrasound. Using ultrasound guidance a 22-gauge, 3.5 inch needle with a mixture of 2:2:1.5 cc's lidocaine :bupivicaine:depomedrol was directed from a lateral to medial direction via in-plane technique into the glenohumeral joint with visualization of  appropriate spread of injectate into the joint. Patient tolerated the procedure well without immediate complications.       Procedure, treatment alternatives, risks and benefits explained, specific risks discussed. Consent was given by the patient. Immediately prior to procedure a time out was called to verify the correct patient, procedure, equipment, support staff and site/side marked as required. Patient was prepped and draped in the usual sterile fashion.     Plan: -Discussed the nature of his chronic right shoulder pain for 2+ years which does have evidence of some instability, as well as a degree of labral blunting without visualized tear of the right intra-articular labrum.  We did proceed with ultrasound-guided right glenohumeral joint injection both for diagnostic and therapeutic purposes today, patient tolerated well.  Advised on postinjection protocol.  He will hold from his HEP for the first 48 to 72 hours and then may return with his rehab. - Discussed having him send me an update at about the 2-week mark to see his degree of improvement.  This would help guide if possible surgical intervention could be considered in the future with Dr. Genelle. - F/u as needed otherwise  Lonell Sprang, DO Primary Care Sports Medicine Physician  [facility]  This note was dictated using Dragon naturally speaking software and may contain errors in syntax, spelling, or content which have not been identified prior to signing this note.

## 2024-01-26 ENCOUNTER — Encounter: Payer: Self-pay | Admitting: Sports Medicine

## 2024-02-29 ENCOUNTER — Encounter: Payer: Self-pay | Admitting: Radiology
